# Patient Record
Sex: Female | Born: 1990 | Race: White | Hispanic: No | Marital: Married | Smoking: Never smoker
Health system: Southern US, Community
[De-identification: ages and names within clinical notes are randomized; demographics above are authoritative.]

## PROBLEM LIST (undated history)

## (undated) DIAGNOSIS — T7840XA Allergy, unspecified, initial encounter: Secondary | ICD-10-CM

## (undated) DIAGNOSIS — D649 Anemia, unspecified: Secondary | ICD-10-CM

## (undated) DIAGNOSIS — R011 Cardiac murmur, unspecified: Secondary | ICD-10-CM

## (undated) DIAGNOSIS — E213 Hyperparathyroidism, unspecified: Secondary | ICD-10-CM

## (undated) DIAGNOSIS — O139 Gestational [pregnancy-induced] hypertension without significant proteinuria, unspecified trimester: Secondary | ICD-10-CM

## (undated) HISTORY — DX: Cardiac murmur, unspecified: R01.1

## (undated) HISTORY — PX: WISDOM TOOTH EXTRACTION: SHX21

## (undated) HISTORY — DX: Hyperparathyroidism, unspecified: E21.3

## (undated) HISTORY — PX: PARATHYROIDECTOMY: SHX19

## (undated) HISTORY — DX: Allergy, unspecified, initial encounter: T78.40XA

---
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
## 2015-06-28 LAB — LIPID PANEL
LDL CALC: 80 mg/dL
TRIGLYCERIDES: 88 mg/dL (ref 40–160)

## 2016-01-29 DIAGNOSIS — Z713 Dietary counseling and surveillance: Secondary | ICD-10-CM | POA: Diagnosis not present

## 2016-02-26 ENCOUNTER — Encounter: Payer: Self-pay | Admitting: Family Medicine

## 2016-02-26 DIAGNOSIS — Z713 Dietary counseling and surveillance: Secondary | ICD-10-CM | POA: Diagnosis not present

## 2016-02-27 ENCOUNTER — Ambulatory Visit (INDEPENDENT_AMBULATORY_CARE_PROVIDER_SITE_OTHER): Payer: BLUE CROSS/BLUE SHIELD | Admitting: Family Medicine

## 2016-02-27 ENCOUNTER — Encounter: Payer: Self-pay | Admitting: Family Medicine

## 2016-02-27 VITALS — BP 130/81 | HR 70 | Temp 98.6°F | Resp 20 | Ht 60.5 in | Wt 123.8 lb

## 2016-02-27 DIAGNOSIS — Z7189 Other specified counseling: Secondary | ICD-10-CM

## 2016-02-27 DIAGNOSIS — R011 Cardiac murmur, unspecified: Secondary | ICD-10-CM | POA: Diagnosis not present

## 2016-02-27 DIAGNOSIS — Z7689 Persons encountering health services in other specified circumstances: Secondary | ICD-10-CM

## 2016-02-27 NOTE — Progress Notes (Addendum)
Patient ID: Padee Wadel, female   DOB: [birth year], 25 y.o.   MRN: [id number]      Patient ID: Amyria Schneiter, female  DOB: 03/26/1991, 25 y.o.   MRN: [id number]  Subjective:  Loza Bonder is a 25 y.o. female present for establish care.  All past medical history, surgical history, allergies, family history, immunizations, medications and social history were obtained in the electronic medical record today. All recent labs, ED visits and hospitalizations within the last year were reviewed.  Health maintenance:  Colonoscopy: No Fhx, Screen at 50.  Mammogram: FHX present. Screen at 40, baseline prior.  Cervical cancer screening: [facility] app[facility]. PAP due 04/2016, all  Normal. Had a cervical polyp removal.  Immunizations: UTD tdap (2008), Influenza yearly.  Infectious disease screening: HIV completed. DEXA: Screen at 58 Assistive device: None  Oxygen use: None  Patient has a Dental home. Hospitalizations/ED visits: None  Depression screen [facility] 2/9 02/27/2016  Decreased Interest 0  Down, Depressed, Hopeless 0  PHQ - 2 Score 0    Current Exercise Habits: Structured exercise class, Type of exercise: Other - see comments;yoga (marathon running), Time (Minutes): 60, Frequency (Times/Week): 3, Weekly Exercise (Minutes/Week): 180, Intensity: Moderate     Past Medical History  Diagnosis Date  . Heart murmur   . Allergy    Allergies  Allergen Reactions  . Amoxicillin Hives  . Keflex [Cephalexin] Hives  . Penicillins Hives   Past Surgical History  Procedure Laterality Date  . Wisdom tooth extraction     Family History  Problem Relation Age of Onset  . Skin cancer Mother   . Asthma Mother   . Breast cancer Paternal Aunt 42  . Heart disease Paternal Grandmother   . Breast cancer Cousin 13  . Dementia Maternal Grandmother   . Asthma Maternal Grandmother   . Multiple sclerosis Cousin   . Miscarriages / Stillbirths Paternal Aunt   . Heart disease Paternal  Grandfather    Social History   Social History  . Marital Status: Married    Spouse Name: N/A  . Number of Children: N/A  . Years of Education: N/A   Occupational History  . Not on file.   Social History Main Topics  . Smoking status: Never Smoker   . Smokeless tobacco: Not on file  . Alcohol Use: Yes     Comment: occasional  . Drug Use: No  . Sexual Activity: Yes    Birth Control/ Protection: Inserts     Comment: Nuvaring   Other Topics Concern  . 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    Drinks caffeine. Herbal remedy use. Daily vitamin use.    Wears her seatbelt and bicycle helmet.    Exercises routinely.    Regular diet.    Smoke detector in the home. No firearms in the home.    Feels safe in her relationships.       ROS: Negative, with the exception of above mentioned in HPI  Objective: BP 130/81 mmHg  Pulse 70  Temp(Src) 98.6 F (37 C) (Oral)  Resp 20  Ht 5' 0.5" (1.537 m)  Wt 123 lb 12 oz (56.133 kg)  BMI 23.76 kg/m2  SpO2 100%  LMP 02/26/2016 Gen: Afebrile. No acute distress. Nontoxic in appearance, well-developed, well-nourished, female, very pleasant.  HENT: AT. [place]. Bilateral TM visualized and normal in appearance, normal external auditory canal. MMM, no oral  lesions Eyes:Pupils Equal Round Reactive to light, Extraocular movements intact,  Conjunctiva without redness, discharge or icterus. Neck/lymp/endocrine: Supple, No  lymphadenopathy, No  thyromegaly CV: RRR, 2/6 SM with radiation to carotid.  No edema, +2/4 P posterior tibialis pulses.  No JVD. Chest: CTAB, no wheeze, rhonchi or crackles. Normal Respiratory effort. Good  Air movement. Abd: Soft. Flat . NTND. BS present . Skin:  Warm and well-perfused. Skin intact. Neuro/Msk: Normal gait. PERLA. EOMi. Alert. Oriented x3.   Psych: Normal affect, dress and demeanor. Normal speech. Normal thought content and  judgment.   Assessment/plan: Mori Duey is a 25 y.o. female present for establish care.  Heart murmur - discussed with pt today, she is asymptomatic, runs marathons in high elevations. Reportedly had full work up as a child and told not concerning.  - Agree with echo prior to pregnancy, since records of prior studies are not available. Pt aware to be seen immediately if Chest pain, shortness of breath or dyspnea on exertion occur.    Return in about 4 months (around 06/28/2016) for CPE.  Electronically signed by: Howard Pouch, DO [place]

## 2016-02-27 NOTE — Patient Instructions (Signed)
Health Maintenance, Female Adopting a healthy lifestyle and getting preventive care can go a long way to promote health and wellness. Talk with your health care provider about what schedule of regular examinations is right for you. This is a good chance for you to check in with your provider about disease prevention and staying healthy. In between checkups, there are plenty of things you can do on your own. Experts have done a lot of research about which lifestyle changes and preventive measures are most likely to keep you healthy. Ask your health care provider for more information. WEIGHT AND DIET  Eat a healthy diet  Be sure to include plenty of vegetables, fruits, low-fat dairy products, and lean protein.  Do not eat a lot of foods high in solid fats, added sugars, or salt.  Get regular exercise. This is one of the most important things you can do for your health.  Most adults should exercise for at least 150 minutes each week. The exercise should increase your heart rate and make you sweat (moderate-intensity exercise).  Most adults should also do strengthening exercises at least twice a week. This is in addition to the moderate-intensity exercise.  Maintain a healthy weight  Body mass index (BMI) is a measurement that can be used to identify possible weight problems. It estimates body fat based on height and weight. Your health care provider can help determine your BMI and help you achieve or maintain a healthy weight.  For females 20 years of age and older:   A BMI below 18.5 is considered underweight.  A BMI of 18.5 to 24.9 is normal.  A BMI of 25 to 29.9 is considered overweight.  A BMI of 30 and above is considered obese.  Watch levels of cholesterol and blood lipids  You should start having your blood tested for lipids and cholesterol at 27 years of age, then have this test every 5 years.  You may need to have your cholesterol levels checked more often if:  Your lipid  or cholesterol levels are high.  You are older than 27 years of age.  You are at high risk for heart disease.  CANCER SCREENING   Lung Cancer  Lung cancer screening is recommended for adults 55-80 years old who are at high risk for lung cancer because of a history of smoking.  A yearly low-dose CT scan of the lungs is recommended for people who:  Currently smoke.  Have quit within the past 15 years.  Have at least a 30-pack-year history of smoking. A pack year is smoking an average of one pack of cigarettes a day for 1 year.  Yearly screening should continue until it has been 15 years since you quit.  Yearly screening should stop if you develop a health problem that would prevent you from having lung cancer treatment.  Breast Cancer  Practice breast self-awareness. This means understanding how your breasts normally appear and feel.  It also means doing regular breast self-exams. Let your health care provider know about any changes, no matter how small.  If you are in your 20s or 30s, you should have a clinical breast exam (CBE) by a health care provider every 1-3 years as part of a regular health exam.  If you are 40 or older, have a CBE every year. Also consider having a breast X-ray (mammogram) every year.  If you have a family history of breast cancer, talk to your health care provider about genetic screening.  If you   are at high risk for breast cancer, talk to your health care provider about having an MRI and a mammogram every year.  Breast cancer gene (BRCA) assessment is recommended for women who have family members with BRCA-related cancers. BRCA-related cancers include:  Breast.  Ovarian.  Tubal.  Peritoneal cancers.  Results of the assessment will determine the need for genetic counseling and BRCA1 and BRCA2 testing. Cervical Cancer Your health care provider may recommend that you be screened regularly for cancer of the pelvic organs (ovaries, uterus, and  vagina). This screening involves a pelvic examination, including checking for microscopic changes to the surface of your cervix (Pap test). You may be encouraged to have this screening done every 3 years, beginning at age 21.  For women ages 30-65, health care providers may recommend pelvic exams and Pap testing every 3 years, or they may recommend the Pap and pelvic exam, combined with testing for human papilloma virus (HPV), every 5 years. Some types of HPV increase your risk of cervical cancer. Testing for HPV may also be done on women of any age with unclear Pap test results.  Other health care providers may not recommend any screening for nonpregnant women who are considered low risk for pelvic cancer and who do not have symptoms. Ask your health care provider if a screening pelvic exam is right for you.  If you have had past treatment for cervical cancer or a condition that could lead to cancer, you need Pap tests and screening for cancer for at least 20 years after your treatment. If Pap tests have been discontinued, your risk factors (such as having a new sexual partner) need to be reassessed to determine if screening should resume. Some women have medical problems that increase the chance of getting cervical cancer. In these cases, your health care provider may recommend more frequent screening and Pap tests. Colorectal Cancer  This type of cancer can be detected and often prevented.  Routine colorectal cancer screening usually begins at 27 years of age and continues through 27 years of age.  Your health care provider may recommend screening at an earlier age if you have risk factors for colon cancer.  Your health care provider may also recommend using home test kits to check for hidden blood in the stool.  A small camera at the end of a tube can be used to examine your colon directly (sigmoidoscopy or colonoscopy). This is done to check for the earliest forms of colorectal  cancer.  Routine screening usually begins at age 50.  Direct examination of the colon should be repeated every 5-10 years through 27 years of age. However, you may need to be screened more often if early forms of precancerous polyps or small growths are found. Skin Cancer  Check your skin from head to toe regularly.  Tell your health care provider about any new moles or changes in moles, especially if there is a change in a mole's shape or color.  Also tell your health care provider if you have a mole that is larger than the size of a pencil eraser.  Always use sunscreen. Apply sunscreen liberally and repeatedly throughout the day.  Protect yourself by wearing long sleeves, pants, a wide-brimmed hat, and sunglasses whenever you are outside. HEART DISEASE, DIABETES, AND HIGH BLOOD PRESSURE   High blood pressure causes heart disease and increases the risk of stroke. High blood pressure is more likely to develop in:  People who have blood pressure in the high end   of the normal range (130-139/85-89 mm Hg).  People who are overweight or obese.  People who are African American.  If you are 38-23 years of age, have your blood pressure checked every 3-5 years. If you are 61 years of age or older, have your blood pressure checked every year. You should have your blood pressure measured twice--once when you are at a hospital or clinic, and once when you are not at a hospital or clinic. Record the average of the two measurements. To check your blood pressure when you are not at a hospital or clinic, you can use:  An automated blood pressure machine at a pharmacy.  A home blood pressure monitor.  If you are between 45 years and 39 years old, ask your health care provider if you should take aspirin to prevent strokes.  Have regular diabetes screenings. This involves taking a blood sample to check your fasting blood sugar level.  If you are at a normal weight and have a low risk for diabetes,  have this test once every three years after 27 years of age.  If you are overweight and have a high risk for diabetes, consider being tested at a younger age or more often. PREVENTING INFECTION  Hepatitis B  If you have a higher risk for hepatitis B, you should be screened for this virus. You are considered at high risk for hepatitis B if:  You were born in a country where hepatitis B is common. Ask your health care provider which countries are considered high risk.  Your parents were born in a high-risk country, and you have not been immunized against hepatitis B (hepatitis B vaccine).  You have HIV or AIDS.  You use needles to inject street drugs.  You live with someone who has hepatitis B.  You have had sex with someone who has hepatitis B.  You get hemodialysis treatment.  You take certain medicines for conditions, including cancer, organ transplantation, and autoimmune conditions. Hepatitis C  Blood testing is recommended for:  Everyone born from [birth year] through 1967.  Anyone with known risk factors for hepatitis C. Sexually transmitted infections (STIs)  You should be screened for sexually transmitted infections (STIs) including gonorrhea and chlamydia if:  You are sexually active and are younger than 27 years of age.  You are older than 27 years of age and your health care provider tells you that you are at risk for this type of infection.  Your sexual activity has changed since you were last screened and you are at an increased risk for chlamydia or gonorrhea. Ask your health care provider if you are at risk.  If you do not have HIV, but are at risk, it may be recommended that you take a prescription medicine daily to prevent HIV infection. This is called pre-exposure prophylaxis (PrEP). You are considered at risk if:  You are sexually active and do not regularly use condoms or know the HIV status of your partner(s).  You take drugs by injection.  You are sexually  active with a partner who has HIV. Talk with your health care provider about whether you are at high risk of being infected with HIV. If you choose to begin PrEP, you should first be tested for HIV. You should then be tested every 3 months for as long as you are taking PrEP.  PREGNANCY   If you are premenopausal and you may become pregnant, ask your health care provider about preconception counseling.  If you may  become pregnant, take 400 to 800 micrograms (mcg) of folic acid every day.  If you want to prevent pregnancy, talk to your health care provider about birth control (contraception). OSTEOPOROSIS AND MENOPAUSE   Osteoporosis is a disease in which the bones lose minerals and strength with aging. This can result in serious bone fractures. Your risk for osteoporosis can be identified using a bone density scan.  If you are 103 years of age or older, or if you are at risk for osteoporosis and fractures, ask your health care provider if you should be screened.  Ask your health care provider whether you should take a calcium or vitamin D supplement to lower your risk for osteoporosis.  Menopause may have certain physical symptoms and risks.  Hormone replacement therapy may reduce some of these symptoms and risks. Talk to your health care provider about whether hormone replacement therapy is right for you.  HOME CARE INSTRUCTIONS   Schedule regular health, dental, and eye exams.  Stay current with your immunizations.   Do not use any tobacco products including cigarettes, chewing tobacco, or electronic cigarettes.  If you are pregnant, do not drink alcohol.  If you are breastfeeding, limit how much and how often you drink alcohol.  Limit alcohol intake to no more than 1 drink per day for nonpregnant women. One drink equals 12 ounces of beer, 5 ounces of wine, or 1 ounces of hard liquor.  Do not use street drugs.  Do not share needles.  Ask your health care provider for help if  you need support or information about quitting drugs.  Tell your health care provider if you often feel depressed.  Tell your health care provider if you have ever been abused or do not feel safe at home.   This information is not intended to replace advice given to you by your health care provider. Make sure you discuss any questions you have with your health care provider.   Document Released: 03/31/2011 Document Revised: 10/06/2014 Document Reviewed: 08/17/2013 Elsevier Interactive Patient Education [employer].  It was a pleasure meeting you today! If you need anything do not hesitate to call for appointment. I am looking forward to taking care of you and helping you stay healthy.  Dr. Raoul Pitch

## 2016-02-28 ENCOUNTER — Encounter: Payer: Self-pay | Admitting: Family Medicine

## 2016-04-22 ENCOUNTER — Telehealth: Payer: Self-pay | Admitting: *Deleted

## 2016-04-22 MED ORDER — ETONOGESTREL-ETHINYL ESTRADIOL 0.12-0.015 MG/24HR VA RING: VAGINAL_RING | VAGINAL | 0 refills | Status: DC

## 2016-04-22 NOTE — Telephone Encounter (Signed)
Patient called and left message requesting refill on nuvaring she states she has an upcoming appt with OB/GYN but is out now.

## 2016-04-22 NOTE — Telephone Encounter (Signed)
I have called in a refill for her nuvaring to get her to her GYN appt.

## 2016-04-22 NOTE — Telephone Encounter (Signed)
Received request from pharmacy to refill Nuvaring. Patient seen 02/27/16 as new patient .  We have never filled this. Please advise

## 2016-04-30 DIAGNOSIS — Z01419 Encounter for gynecological examination (general) (routine) without abnormal findings: Secondary | ICD-10-CM | POA: Diagnosis not present

## 2016-04-30 DIAGNOSIS — Z6824 Body mass index (BMI) 24.0-24.9, adult: Secondary | ICD-10-CM | POA: Diagnosis not present

## 2016-07-02 DIAGNOSIS — Z23 Encounter for immunization: Secondary | ICD-10-CM | POA: Diagnosis not present

## 2016-07-15 DIAGNOSIS — Z713 Dietary counseling and surveillance: Secondary | ICD-10-CM | POA: Diagnosis not present

## 2016-12-04 DIAGNOSIS — Z713 Dietary counseling and surveillance: Secondary | ICD-10-CM | POA: Diagnosis not present

## 2016-12-19 LAB — LIPID PANEL
CHOLESTEROL: 176 mg/dL (ref 0–200)
HDL: 81 mg/dL — AB (ref 35–70)

## 2017-02-09 ENCOUNTER — Encounter: Payer: BLUE CROSS/BLUE SHIELD | Admitting: Family Medicine

## 2017-02-13 ENCOUNTER — Encounter: Payer: Self-pay | Admitting: Family Medicine

## 2017-02-13 ENCOUNTER — Encounter: Payer: Self-pay | Admitting: *Deleted

## 2017-02-13 ENCOUNTER — Ambulatory Visit (INDEPENDENT_AMBULATORY_CARE_PROVIDER_SITE_OTHER): Payer: BLUE CROSS/BLUE SHIELD | Admitting: Family Medicine

## 2017-02-13 VITALS — BP 130/80 | HR 83 | Temp 98.1°F | Resp 20 | Ht 61.0 in | Wt 127.8 lb

## 2017-02-13 DIAGNOSIS — Z79899 Other long term (current) drug therapy: Secondary | ICD-10-CM

## 2017-02-13 DIAGNOSIS — Z0000 Encounter for general adult medical examination without abnormal findings: Secondary | ICD-10-CM

## 2017-02-13 DIAGNOSIS — R011 Cardiac murmur, unspecified: Secondary | ICD-10-CM | POA: Diagnosis not present

## 2017-02-13 DIAGNOSIS — Z130 Encounter for screening for diseases of the blood and blood-forming organs and certain disorders involving the immune mechanism: Secondary | ICD-10-CM | POA: Diagnosis not present

## 2017-02-13 DIAGNOSIS — Z23 Encounter for immunization: Secondary | ICD-10-CM

## 2017-02-13 LAB — COMPREHENSIVE METABOLIC PANEL
ALBUMIN: 4.7 g/dL (ref 3.5–5.2)
ALK PHOS: 55 U/L (ref 39–117)
ALT: 27 U/L (ref 0–35)
AST: 24 U/L (ref 0–37)
BUN: 14 mg/dL (ref 6–23)
CO2: 27 mEq/L (ref 19–32)
Calcium: 10.3 mg/dL (ref 8.4–10.5)
Chloride: 105 mEq/L (ref 96–112)
Creatinine, Ser: 0.86 mg/dL (ref 0.40–1.20)
GFR: 83.52 mL/min (ref >60.00)
GLUCOSE: 88 mg/dL (ref 70–99)
POTASSIUM: 4.1 meq/L (ref 3.5–5.1)
Sodium: 139 mEq/L (ref 135–145)
Total Bilirubin: 0.6 mg/dL (ref 0.2–1.2)
Total Protein: 7.4 g/dL (ref 6.0–8.3)

## 2017-02-13 LAB — CBC WITH DIFFERENTIAL/PLATELET
BASOS PCT: 0.7 % (ref 0.0–3.0)
Basophils Absolute: 0 10*3/uL (ref 0.0–0.1)
EOS PCT: 1.2 % (ref 0.0–5.0)
Eosinophils Absolute: 0.1 10*3/uL (ref 0.0–0.7)
HCT: 38.7 % (ref 36.0–46.0)
Hemoglobin: 13.2 g/dL (ref 12.0–15.0)
LYMPHS ABS: 3 10*3/uL (ref 0.7–4.0)
Lymphocytes Relative: 50 % — ABNORMAL HIGH (ref 12.0–46.0)
MCHC: 34.1 g/dL (ref 30.0–36.0)
MCV: 90 fl (ref 78.0–100.0)
MONOS PCT: 6.1 % (ref 3.0–12.0)
Monocytes Absolute: 0.4 10*3/uL (ref 0.1–1.0)
NEUTROS PCT: 42 % — AB (ref 43.0–77.0)
Neutro Abs: 2.5 10*3/uL (ref 1.4–7.7)
Platelets: 255 10*3/uL (ref 150.0–400.0)
RBC: 4.3 Mil/uL (ref 3.87–5.11)
RDW: 12.7 % (ref 11.5–15.5)
WBC: 5.9 10*3/uL (ref 4.0–10.5)

## 2017-02-13 LAB — TSH: TSH: 4.51 u[IU]/mL — AB (ref 0.35–4.50)

## 2017-02-13 NOTE — Patient Instructions (Signed)
Start prenatal at least 1 month before starting to try.    I will call you with labs once resulted. They will call to schedule echo of heart.   Health Maintenance, Female Adopting a healthy lifestyle and getting preventive care can go a long way to promote health and wellness. Talk with your health care provider about what schedule of regular examinations is right for you. This is a good chance for you to check in with your provider about disease prevention and staying healthy. In between checkups, there are plenty of things you can do on your own. Experts have done a lot of research about which lifestyle changes and preventive measures are most likely to keep you healthy. Ask your health care provider for more information. Weight and diet Eat a healthy diet  Be sure to include plenty of vegetables, fruits, low-fat dairy products, and lean protein.  Do not eat a lot of foods high in solid fats, added sugars, or salt.  Get regular exercise. This is one of the most important things you can do for your health.  Most adults should exercise for at least 150 minutes each week. The exercise should increase your heart rate and make you sweat (moderate-intensity exercise).  Most adults should also do strengthening exercises at least twice a week. This is in addition to the moderate-intensity exercise. Maintain a healthy weight  Body mass index (BMI) is a measurement that can be used to identify possible weight problems. It estimates body fat based on height and weight. Your health care provider can help determine your BMI and help you achieve or maintain a healthy weight.  For females 49 years of age and older:  A BMI below 18.5 is considered underweight.  A BMI of 18.5 to 24.9 is normal.  A BMI of 25 to 29.9 is considered overweight.  A BMI of 30 and above is considered obese. Watch levels of cholesterol and blood lipids  You should start having your blood tested for lipids and  cholesterol at 28 years of age, then have this test every 5 years.  You may need to have your cholesterol levels checked more often if:  Your lipid or cholesterol levels are high.  You are older than 28 years of age.  You are at high risk for heart disease. Cancer screening Lung Cancer  Lung cancer screening is recommended for adults 23-31 years old who are at high risk for lung cancer because of a history of smoking.  A yearly low-dose CT scan of the lungs is recommended for people who:  Currently smoke.  Have quit within the past 15 years.  Have at least a 30-pack-year history of smoking. A pack year is smoking an average of one pack of cigarettes a day for 1 year.  Yearly screening should continue until it has been 15 years since you quit.  Yearly screening should stop if you develop a health problem that would prevent you from having lung cancer treatment. Breast Cancer  Practice breast self-awareness. This means understanding how your breasts normally appear and feel.  It also means doing regular breast self-exams. Let your health care provider know about any changes, no matter how small.  If you are in your 20s or 30s, you should have a clinical breast exam (CBE) by a health care provider every 1-3 years as part of a regular health exam.  If you are 46 or older, have a CBE every year. Also consider having a breast X-ray (mammogram) every  year.  If you have a family history of breast cancer, talk to your health care provider about genetic screening.  If you are at high risk for breast cancer, talk to your health care provider about having an MRI and a mammogram every year.  Breast cancer gene (BRCA) assessment is recommended for women who have family members with BRCA-related cancers. BRCA-related cancers include:  Breast.  Ovarian.  Tubal.  Peritoneal cancers.  Results of the assessment will determine the need for genetic counseling and BRCA1 and BRCA2  testing. Cervical Cancer  Your health care provider may recommend that you be screened regularly for cancer of the pelvic organs (ovaries, uterus, and vagina). This screening involves a pelvic examination, including checking for microscopic changes to the surface of your cervix (Pap test). You may be encouraged to have this screening done every 3 years, beginning at age 82.  For women ages 33-65, health care providers may recommend pelvic exams and Pap testing every 3 years, or they may recommend the Pap and pelvic exam, combined with testing for human papilloma virus (HPV), every 5 years. Some types of HPV increase your risk of cervical cancer. Testing for HPV may also be done on women of any age with unclear Pap test results.  Other health care providers may not recommend any screening for nonpregnant women who are considered low risk for pelvic cancer and who do not have symptoms. Ask your health care provider if a screening pelvic exam is right for you.  If you have had past treatment for cervical cancer or a condition that could lead to cancer, you need Pap tests and screening for cancer for at least 20 years after your treatment. If Pap tests have been discontinued, your risk factors (such as having a new sexual partner) need to be reassessed to determine if screening should resume. Some women have medical problems that increase the chance of getting cervical cancer. In these cases, your health care provider may recommend more frequent screening and Pap tests. Colorectal Cancer  This type of cancer can be detected and often prevented.  Routine colorectal cancer screening usually begins at 28 years of age and continues through 28 years of age.  Your health care provider may recommend screening at an earlier age if you have risk factors for colon cancer.  Your health care provider may also recommend using home test kits to check for hidden blood in the stool.  A small camera at the end of a  tube can be used to examine your colon directly (sigmoidoscopy or colonoscopy). This is done to check for the earliest forms of colorectal cancer.  Routine screening usually begins at age 69.  Direct examination of the colon should be repeated every 5-10 years through 28 years of age. However, you may need to be screened more often if early forms of precancerous polyps or small growths are found. Skin Cancer  Check your skin from head to toe regularly.  Tell your health care provider about any new moles or changes in moles, especially if there is a change in a mole's shape or color.  Also tell your health care provider if you have a mole that is larger than the size of a pencil eraser.  Always use sunscreen. Apply sunscreen liberally and repeatedly throughout the day.  Protect yourself by wearing long sleeves, pants, a wide-brimmed hat, and sunglasses whenever you are outside. Heart disease, diabetes, and high blood pressure  High blood pressure causes heart disease and increases  the risk of stroke. High blood pressure is more likely to develop in:  People who have blood pressure in the high end of the normal range (130-139/85-89 mm Hg).  People who are overweight or obese.  People who are African American.  If you are 75-35 years of age, have your blood pressure checked every 3-5 years. If you are 80 years of age or older, have your blood pressure checked every year. You should have your blood pressure measured twice-once when you are at a hospital or clinic, and once when you are not at a hospital or clinic. Record the average of the two measurements. To check your blood pressure when you are not at a hospital or clinic, you can use:  An automated blood pressure machine at a pharmacy.  A home blood pressure monitor.  If you are between 41 years and 32 years old, ask your health care provider if you should take aspirin to prevent strokes.  Have regular diabetes screenings. This  involves taking a blood sample to check your fasting blood sugar level.  If you are at a normal weight and have a low risk for diabetes, have this test once every three years after 28 years of age.  If you are overweight and have a high risk for diabetes, consider being tested at a younger age or more often. Preventing infection Hepatitis B  If you have a higher risk for hepatitis B, you should be screened for this virus. You are considered at high risk for hepatitis B if:  You were born in a country where hepatitis B is common. Ask your health care provider which countries are considered high risk.  Your parents were born in a high-risk country, and you have not been immunized against hepatitis B (hepatitis B vaccine).  You have HIV or AIDS.  You use needles to inject street drugs.  You live with someone who has hepatitis B.  You have had sex with someone who has hepatitis B.  You get hemodialysis treatment.  You take certain medicines for conditions, including cancer, organ transplantation, and autoimmune conditions. Hepatitis C  Blood testing is recommended for:  Everyone born from [birth year] through 1967.  Anyone with known risk factors for hepatitis C. Sexually transmitted infections (STIs)  You should be screened for sexually transmitted infections (STIs) including gonorrhea and chlamydia if:  You are sexually active and are younger than 28 years of age.  You are older than 28 years of age and your health care provider tells you that you are at risk for this type of infection.  Your sexual activity has changed since you were last screened and you are at an increased risk for chlamydia or gonorrhea. Ask your health care provider if you are at risk.  If you do not have HIV, but are at risk, it may be recommended that you take a prescription medicine daily to prevent HIV infection. This is called pre-exposure prophylaxis (PrEP). You are considered at risk if:  You are  sexually active and do not regularly use condoms or know the HIV status of your partner(s).  You take drugs by injection.  You are sexually active with a partner who has HIV. Talk with your health care provider about whether you are at high risk of being infected with HIV. If you choose to begin PrEP, you should first be tested for HIV. You should then be tested every 3 months for as long as you are taking PrEP. Pregnancy  If  you are premenopausal and you may become pregnant, ask your health care provider about preconception counseling.  If you may become pregnant, take 400 to 800 micrograms (mcg) of folic acid every day.  If you want to prevent pregnancy, talk to your health care provider about birth control (contraception). Osteoporosis and menopause  Osteoporosis is a disease in which the bones lose minerals and strength with aging. This can result in serious bone fractures. Your risk for osteoporosis can be identified using a bone density scan.  If you are 10 years of age or older, or if you are at risk for osteoporosis and fractures, ask your health care provider if you should be screened.  Ask your health care provider whether you should take a calcium or vitamin D supplement to lower your risk for osteoporosis.  Menopause may have certain physical symptoms and risks.  Hormone replacement therapy may reduce some of these symptoms and risks. Talk to your health care provider about whether hormone replacement therapy is right for you. Follow these instructions at home:  Schedule regular health, dental, and eye exams.  Stay current with your immunizations.  Do not use any tobacco products including cigarettes, chewing tobacco, or electronic cigarettes.  If you are pregnant, do not drink alcohol.  If you are breastfeeding, limit how much and how often you drink alcohol.  Limit alcohol intake to no more than 1 drink per day for nonpregnant women. One drink equals 12 ounces of  beer, 5 ounces of wine, or 1 ounces of hard liquor.  Do not use street drugs.  Do not share needles.  Ask your health care provider for help if you need support or information about quitting drugs.  Tell your health care provider if you often feel depressed.  Tell your health care provider if you have ever been abused or do not feel safe at home. This information is not intended to replace advice given to you by your health care provider. Make sure you discuss any questions you have with your health care provider. Document Released: 03/31/2011 Document Revised: 02/21/2016 Document Reviewed: 06/19/2015 Elsevier Interactive Patient Education  2017 [employer]  Please help [place] help you:  We are honored you have chosen [facility] for your Primary Care home. Below you will find basic instructions that you may need to access in the future. Please help [place] help you by reading the instructions, which cover many of the frequent questions we experience.   Prescription refills and request:  -In order to allow more efficient response time, please call your pharmacy for all refills. They will forward the request electronically to [place]. This allows for the quickest possible response. Request left on a nurse line can take longer to refill, since these are checked as time allows between office patients and other phone calls.  - refill request can take up to 3-5 working days to complete.  - If request is sent electronically and request is appropiate, it is usually completed in 1-2 business days.  - all patients will need to be seen routinely for all chronic medical conditions requiring prescription medications (see follow-up below). If you are overdue for follow up on your condition, you will be asked to make an appointment and we will call in enough medication to cover you until your appointment (up to 30 days).  - all controlled substances will require a face to face visit to request/refill.  - if  you desire your prescriptions to go through a new pharmacy,  and have an active script at original pharmacy, you will need to call your pharmacy and have scripts transferred to new pharmacy. This is completed between the pharmacy locations and not by your provider.    Results: If any images or labs were ordered, it can take up to 1 week to get results depending on the test ordered and the lab/facility running and resulting the test. - Normal or stable results, which do not need further discussion, may be released to your mychart immediately with attached note to you. A call may not be generated for normal results. Please make certain to sign up for mychart. If you have questions on how to activate your mychart you can call the front office.  - If your results need further discussion, our office will attempt to contact you via phone, and if unable to reach you after 2 attempts, we will release your abnormal result to your mychart with instructions.  - All results will be automatically released in mychart after 1 week.  - Your provider will provide you with explanation and instruction on all relevant material in your results. Please keep in mind, results and labs may appear confusing or abnormal to the untrained eye, but it does not mean they are actually abnormal for you personally. If you have any questions about your results that are not covered, or you desire more detailed explanation than what was provided, you should make an appointment with your provider to do so.   Our office handles many outgoing and incoming calls daily. If we have not contacted you within 1 week about your results, please check your mychart to see if there is a message first and if not, then contact our office.  In helping with this matter, you help decrease call volume, and therefore allow [place] to be able to respond to patients needs more efficiently.   Acute office visits (sick visit):  An acute visit is intended for a new  problem and are scheduled in shorter time slots to allow schedule openings for patients with new problems. This is the appropriate visit to discuss a new problem. In order to provide you with excellent quality medical care with proper time for you to explain your problem, have an exam and receive treatment with instructions, these appointments should be limited to one new problem per visit. If you experience a new problem, in which you desire to be addressed, please make an acute office visit, we save openings on the schedule to accommodate you. Please do not save your new problem for any other type of visit, let us take care of it properly and quickly for you.   Follow up visits:  Depending on your condition(s) your provider will need to see you routinely in order to provide you with quality care and prescribe medication(s). Most chronic conditions (Example: hypertension, Diabetes, depression/anxiety... etc), require visits a couple times a year. Your provider will instruct you on proper follow up for your personal medical conditions and history. Please make certain to make follow up appointments for your condition as instructed. Failing to do so could result in lapse in your medication treatment/refills. If you request a refill, and are overdue to be seen on a condition, we will always provide you with a 30 day script (once) to allow you time to schedule.    Medicare wellness (well visit): - we have a wonderful Nurse Selena Batten), that will meet with you and provide you will yearly medicare wellness visits. These visits should  occur yearly (can not be scheduled less than 1 calendar year apart) and cover preventive health, immunizations, advance directives and screenings you are entitled to yearly through your medicare benefits. Do not miss out on your entitled benefits, this is when medicare will pay for these benefits to be ordered for you.  These are strongly encouraged by your provider and is the appropriate  type of visit to make certain you are up to date with all preventive health benefits. If you have not had your medicare wellness exam in the last 12 months, please make certain to schedule one by calling the office and schedule your medicare wellness with Maudie Mercury as soon as possible.   Yearly physical (well visit):  - Adults are recommended to be seen yearly for physicals. Check with your insurance and date of your last physical, most insurances require one calendar year between physicals. Physicals include all preventive health topics, screenings, medical exam and labs that are appropriate for gender/age and history. You may have fasting labs needed at this visit. This is a well visit (not a sick visit), new problems should not be covered during this visit (see acute visit).  - Pediatric patients are seen more frequently when they are younger. Your provider will advise you on well child visit timing that is appropriate for your their age. - This is not a medicare wellness visit. Medicare wellness exams do not have an exam portion to the visit. Some medicare companies allow for a physical, some do not allow a yearly physical. If your medicare allows a yearly physical you can schedule the medicare wellness with our nurse Maudie Mercury and have your physical with your provider after, on the same day. Please check with insurance for your full benefits.   Late Policy/No Shows:  - all new patients should arrive 15-30 minutes earlier than appointment to allow [place] time  to  obtain all personal demographics,  insurance information and for you to complete office paperwork. - All established patients should arrive 10-15 minutes earlier than appointment time to update all information and be checked in .  - In our best efforts to run on time, if you are late for your appointment you will be asked to either reschedule or if able, we will work you back into the schedule. There will be a wait time to work you back in the schedule,   depending on availability.  - If you are unable to make it to your appointment as scheduled, please call 24 hours ahead of time to allow [place] to fill the time slot with someone else who needs to be seen. If you do not cancel your appointment ahead of time, you may be charged a no show fee.

## 2017-02-13 NOTE — Progress Notes (Signed)
Patient ID: Kristina Rhodes, female  DOB: 07-25-1991, 26 y.o.   MRN: [id number] Patient Care Team    Relationship Specialty Notifications Start End  Ma Hillock, DO PCP - General Family Medicine  02/27/16   Linda Hedges, DO Consulting Physician Obstetrics and Gynecology  02/13/17     Chief Complaint  Patient presents with  . Annual Exam    Subjective:  Kristina Rhodes is a 26 y.o.  Female  present for CPE . All past medical history, surgical history, allergies, family history, immunizations, medications and social history were updated in the electronic medical record today. All recent labs, ED visits and hospitalizations within the last year were reviewed.  Murmur: now trying to become pregnant.  Pt doe snot recall the specifics around murmur. 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 She denies chest pain, shortness of breath or LE edema. She runs daily.  Health maintenance:  Colonoscopy: No Fhx, Screen at 50.  Mammogram: FHX present. Screen at 40, baseline prior.  Cervical cancer screening: has GYN, Dr. Lynnette Caffey UTD 2017 Immunizations: UTD tdap today, Influenza yearly.  Infectious disease screening: HIV completed. DEXA: Screen at 58 Assistive device: none Oxygen YDS:WVTV Patient has a Dental home. Hospitalizations/ED visits: none   Depression screen [facility] 2/9 02/13/2017 02/27/2016  Decreased Interest 0 0  Down, Depressed, Hopeless 0 0  PHQ - 2 Score 0 0   No flowsheet data found.   Current Exercise Habits: Home exercise routine, Time (Minutes): 60, Frequency (Times/Week): 3, Weekly Exercise (Minutes/Week): 180, Intensity: Moderate Exercise limited by: None identified   Immunization History  Administered Date(s) Administered  . DTaP 04/20/1989, 07/08/1989, 09/03/1989, 08/30/1990, 06/24/1994  . HPV Quadrivalent 09/16/2004, 11/21/2005, 03/18/2006  . Hepatitis A 09/16/2005, 03/18/2006  . Hepatitis B 06/24/1994, 08/06/1994,  01/23/1995  . IPV 04/20/1989, 08/30/1990, 06/24/1994  . Influenza Split 07/08/1989, 06/29/2013  . MMR 05/26/1990, 06/05/1994  . Meningococcal Conjugate 03/18/2006  . Td 12/13/2002  . Tdap 03/22/2007, 02/13/2017  . Varicella 08/06/1994, 03/08/2007     Past Medical History:  Diagnosis Date  . Allergy   . Heart murmur    Allergies  Allergen Reactions  . Amoxicillin Hives  . Keflex [Cephalexin] Hives  . Penicillins Hives   Past Surgical History:  Procedure Laterality Date  . WISDOM TOOTH EXTRACTION     Family History  Problem Relation Age of Onset  . Skin cancer Mother   . Asthma Mother   . Breast cancer Paternal Aunt 54  . Heart disease Paternal Grandmother   . Breast cancer Cousin 21  . Dementia Maternal Grandmother   . Asthma Maternal Grandmother   . Multiple sclerosis Cousin   . Miscarriages / Stillbirths Paternal Aunt   . Heart disease Paternal Grandfather    Social History   Social History  . Marital status: Married    Spouse name: N/A  . Number of children: N/A  . Years of education: N/A   Occupational History  . Not on file.   Social History Main Topics  . Smoking status: Never Smoker  . Smokeless tobacco: Never Used  . Alcohol use Yes     Comment: occasional  . Drug use: No  . Sexual activity: Yes    Birth control/ protection: None   Other Topics Concern  . 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    Drinks caffeine.  Herbal remedy use. Daily vitamin use.    Wears her seatbelt and bicycle helmet.    Exercises routinely.    Regular diet.    Smoke detector in the home. No firearms in the home.    Feels safe in her relationships.       Allergies as of 02/13/2017      Reactions   Amoxicillin Hives   Keflex [cephalexin] Hives   Penicillins Hives      Medication List       Accurate as of 02/13/17 10:25 AM. Always use your most recent med list.          ascorbic acid 500  MG tablet Commonly known as:  VITAMIN C Take by mouth.   loratadine 10 MG tablet Commonly known as:  CLARITIN Take 10 mg by mouth daily.   multivitamin with minerals tablet Take 1 tablet by mouth daily.       All past medical history, surgical history, allergies, family history, immunizations andmedications were updated in the EMR today and reviewed under the history and medication portions of their EMR.     Recent Results (from the past 2160 hour(s))  Lipid panel     Status: Abnormal   Collection Time: 12/19/16 12:00 AM  Result Value Ref Range   Cholesterol 176 0 - 200 mg/dL   HDL 81 (A) 35 - 70 mg/dL    Patient was never admitted.   ROS: 14 pt review of systems performed and negative (unless mentioned in an HPI)  Objective: BP 130/80 (BP Location: Left Arm, Patient Position: Sitting, Cuff Size: Normal)   Pulse 83   Temp 98.1 F (36.7 C)   Resp 20   Ht '5\' 1"'$  (1.549 m)   Wt 127 lb 12 oz (57.9 kg)   LMP 02/05/2017   SpO2 100%   BMI 24.14 kg/m  Gen: Afebrile. No acute distress. Nontoxic in appearance, well-developed, well-nourished,   HENT: AT. [place]. Bilateral TM visualized and normal in appearance, normal external auditory canal. MMM, no oral lesions, adequate dentition. Bilateral nares within normal limits. Throat without erythema, ulcerations or exudates. no Cough on exam, no hoarseness on exam. Eyes:Pupils Equal Round Reactive to light, Extraocular movements intact,  Conjunctiva without redness, discharge or icterus. Neck/lymp/endocrine: Supple,no lymphadenopathy, no thyromegaly CV: RRR 1/6 SM LSB, noedema, +2/4 P posterior tibialis pulses. no carotid bruits. No JVD. Chest: CTAB, no wheeze, rhonchi or crackles. normal Respiratory effort. good Air movement. Abd: Soft. flat. NTND. BS present. no Masses palpated. No hepatosplenomegaly. No rebound tenderness or guarding. Skin: No rashes, purpura or petechiae. Warm and well-perfused. Skin intact. Neuro/Msk: Normal gait.  PERLA. EOMi. Alert. Oriented x3.  Cranial nerves II through XII intact. Muscle strength 5/5 upper/lower extremity. DTRs equal bilaterally. Psych: Normal affect, dress and demeanor. Normal speech. Normal thought content and judgment.   No exam data present  Assessment/plan: Honor Frison is a 26 y.o. female present for CPE. Encounter for preventive health examination Patient was encouraged to exercise greater than 150 minutes a week. Patient was encouraged to choose a diet filled with fresh fruits and vegetables, and lean meats. AVS provided to patient today for education/recommendation on gender specific health and safety maintenance. tdap provided.  All screenings and immunizations UTD.  Outside labs abstracted into system today.  Start prenatal vitamins at least 1 month before trying.  Immunization due - Tdap vaccine greater than or equal to 7yo IM Heart murmur - Will forward echo to GYN once received. If needed will refer  to Cardio.  - TSH - ECHOCARDIOGRAM COMPLETE; Future Screening for deficiency anemia - CBC w/Diff Encounter for long-term (current) use of medications - Comp Met (CMET) - TSH    Return in about 1 year (around 02/13/2018) for CPE.  Electronically signed by: Howard Pouch, DO [place]

## 2017-02-17 ENCOUNTER — Telehealth: Payer: Self-pay | Admitting: Family Medicine

## 2017-02-17 DIAGNOSIS — R7989 Other specified abnormal findings of blood chemistry: Secondary | ICD-10-CM

## 2017-02-17 NOTE — Telephone Encounter (Signed)
Left message on  Voice mail for patient to return call.

## 2017-02-17 NOTE — Telephone Encounter (Signed)
Error

## 2017-02-17 NOTE — Telephone Encounter (Signed)
Patient advised of normal results

## 2017-02-17 NOTE — Telephone Encounter (Signed)
Please call pt: - her labs are stable.  - her thyroid is 4.51, which reports abnormal range normal is 4.5. It is normal to have fluctuations such as this, but she should discuss with her GYN/OB and have it monitored again during pregnancy or in 3 months (either at GYN or here). - Future labs placed for here, in 2 months if she desires. Can have labs collected 2 days prior to a provider appt.

## 2017-03-11 ENCOUNTER — Ambulatory Visit (HOSPITAL_BASED_OUTPATIENT_CLINIC_OR_DEPARTMENT_OTHER)
Admission: RE | Admit: 2017-03-11 | Discharge: 2017-03-11 | Disposition: A | Discharge status: Home / Self Care | Payer: BLUE CROSS/BLUE SHIELD | Source: Ambulatory Visit | Attending: Family Medicine | Admitting: Family Medicine

## 2017-03-11 ENCOUNTER — Telehealth: Payer: Self-pay | Admitting: Family Medicine

## 2017-03-11 DIAGNOSIS — R011 Cardiac murmur, unspecified: Secondary | ICD-10-CM

## 2017-03-11 DIAGNOSIS — I340 Nonrheumatic mitral (valve) insufficiency: Secondary | ICD-10-CM | POA: Diagnosis not present

## 2017-03-11 NOTE — Telephone Encounter (Signed)
Left message with Echo results and information on patient voice mail per DPR.

## 2017-03-11 NOTE — Progress Notes (Signed)
  Echocardiogram 2D Echocardiogram has been performed.  Johny Chess 03/11/2017, 9:07 AM

## 2017-03-11 NOTE — Telephone Encounter (Signed)
Please call pt:  - her echo looks good. Just a very minor/trival Mitral valve regurgitation, which should not need any further follow up. This is commonly seen and typically does not cause any issues, will forward result ot GYN as well.

## 2017-06-18 DIAGNOSIS — Z23 Encounter for immunization: Secondary | ICD-10-CM | POA: Diagnosis not present

## 2017-08-04 DIAGNOSIS — N911 Secondary amenorrhea: Secondary | ICD-10-CM | POA: Diagnosis not present

## 2017-08-13 DIAGNOSIS — Z3400 Encounter for supervision of normal first pregnancy, unspecified trimester: Secondary | ICD-10-CM | POA: Diagnosis not present

## 2017-08-13 DIAGNOSIS — Z3402 Encounter for supervision of normal first pregnancy, second trimester: Secondary | ICD-10-CM | POA: Diagnosis not present

## 2017-08-13 DIAGNOSIS — Z3A08 8 weeks gestation of pregnancy: Secondary | ICD-10-CM | POA: Diagnosis not present

## 2017-08-13 DIAGNOSIS — Z3480 Encounter for supervision of other normal pregnancy, unspecified trimester: Secondary | ICD-10-CM | POA: Diagnosis not present

## 2017-08-13 DIAGNOSIS — Z3685 Encounter for antenatal screening for Streptococcus B: Secondary | ICD-10-CM | POA: Diagnosis not present

## 2017-08-13 DIAGNOSIS — Z3403 Encounter for supervision of normal first pregnancy, third trimester: Secondary | ICD-10-CM | POA: Diagnosis not present

## 2017-08-13 DIAGNOSIS — Z3401 Encounter for supervision of normal first pregnancy, first trimester: Secondary | ICD-10-CM | POA: Diagnosis not present

## 2017-08-13 DIAGNOSIS — Z3A09 9 weeks gestation of pregnancy: Secondary | ICD-10-CM | POA: Diagnosis not present

## 2017-08-13 LAB — OB RESULTS CONSOLE RPR: RPR: NONREACTIVE

## 2017-08-13 LAB — OB RESULTS CONSOLE RUBELLA ANTIBODY, IGM: RUBELLA: UNDETERMINED

## 2017-08-13 LAB — OB RESULTS CONSOLE ABO/RH: RH Type: POSITIVE

## 2017-08-13 LAB — OB RESULTS CONSOLE HEPATITIS B SURFACE ANTIGEN: Hepatitis B Surface Ag: NEGATIVE

## 2017-08-13 LAB — OB RESULTS CONSOLE HIV ANTIBODY (ROUTINE TESTING): HIV: NONREACTIVE

## 2017-08-25 DIAGNOSIS — Z3A09 9 weeks gestation of pregnancy: Secondary | ICD-10-CM | POA: Diagnosis not present

## 2017-08-25 DIAGNOSIS — Z3480 Encounter for supervision of other normal pregnancy, unspecified trimester: Secondary | ICD-10-CM | POA: Diagnosis not present

## 2017-08-25 DIAGNOSIS — Z3400 Encounter for supervision of normal first pregnancy, unspecified trimester: Secondary | ICD-10-CM | POA: Diagnosis not present

## 2017-08-25 LAB — OB RESULTS CONSOLE GC/CHLAMYDIA
CHLAMYDIA, DNA PROBE: NEGATIVE
Gonorrhea: NEGATIVE

## 2017-08-25 LAB — OB RESULTS CONSOLE GBS: STREP GROUP B AG: NEGATIVE

## 2017-09-08 DIAGNOSIS — G8911 Acute pain due to trauma: Secondary | ICD-10-CM | POA: Diagnosis not present

## 2017-09-08 DIAGNOSIS — S42009A Fracture of unspecified part of unspecified clavicle, initial encounter for closed fracture: Secondary | ICD-10-CM | POA: Diagnosis not present

## 2017-09-10 DIAGNOSIS — Z3683 Encounter for fetal screening for congenital cardiac abnormalities: Secondary | ICD-10-CM | POA: Diagnosis not present

## 2017-09-10 DIAGNOSIS — Z3491 Encounter for supervision of normal pregnancy, unspecified, first trimester: Secondary | ICD-10-CM | POA: Diagnosis not present

## 2017-09-10 DIAGNOSIS — Z36 Encounter for antenatal screening for chromosomal anomalies: Secondary | ICD-10-CM | POA: Diagnosis not present

## 2017-09-10 DIAGNOSIS — Z3A12 12 weeks gestation of pregnancy: Secondary | ICD-10-CM | POA: Diagnosis not present

## 2017-10-22 DIAGNOSIS — Z363 Encounter for antenatal screening for malformations: Secondary | ICD-10-CM | POA: Diagnosis not present

## 2017-10-22 DIAGNOSIS — Z3480 Encounter for supervision of other normal pregnancy, unspecified trimester: Secondary | ICD-10-CM | POA: Diagnosis not present

## 2017-11-20 ENCOUNTER — Encounter: Payer: Self-pay | Admitting: Family Medicine

## 2017-11-20 ENCOUNTER — Ambulatory Visit: Payer: BLUE CROSS/BLUE SHIELD | Admitting: Family Medicine

## 2017-11-20 VITALS — BP 118/62 | HR 94 | Temp 98.6°F | Ht 61.0 in | Wt 144.0 lb

## 2017-11-20 DIAGNOSIS — J069 Acute upper respiratory infection, unspecified: Secondary | ICD-10-CM

## 2017-11-20 NOTE — Progress Notes (Signed)
OFFICE VISIT  11/20/2017   CC:  Chief Complaint  Patient presents with  . Sinusitis   HPI:    Patient is a 27 y.o. Caucasian female who presents for respiratory symptoms. Onset yesterday, runny nose, then nasal/facial congestion and pressure.  ST yesterday. No cough.  Has some PND.  Diffuse upper teeth pain-mild, no signif max sinus pain.  No purulent-appearing nasal mucous. HA.  No fever.  Went to [facility] for her pregnancy check up today: 22 wks--having a girl.  Past Medical History:  Diagnosis Date  . Allergy   . Heart murmur     Past Surgical History:  Procedure Laterality Date  . WISDOM TOOTH EXTRACTION      Outpatient Medications Prior to Visit  Medication Sig Dispense Refill  . Docosahexaenoic Acid (PRENATAL DHA) 200 MG CAPS Take by mouth.    Marland Kitchen ascorbic acid (VITAMIN C) 500 MG tablet Take by mouth.    . loratadine (CLARITIN) 10 MG tablet Take 10 mg by mouth daily.    . Multiple Vitamins-Minerals (MULTIVITAMIN WITH MINERALS) tablet Take 1 tablet by mouth daily.     No facility-administered medications prior to visit.     Allergies  Allergen Reactions  . Amoxicillin Hives  . Keflex [Cephalexin] Hives  . Penicillins Hives  . Cephalosporins Rash    ROS As per HPI  PE: Blood pressure 118/62, pulse 94, temperature 98.6 F (37 C), temperature source Oral, height 5\' 1"  (1.549 m), weight 144 lb (65.3 kg), SpO2 99 %. VS: noted--normal. Gen: alert, NAD, NONTOXIC APPEARING. HEENT: eyes without injection, drainage, or swelling.  Ears: EACs clear, TMs with normal light reflex and landmarks.  Nose: Clear rhinorrhea, with some dried, crusty exudate adherent to mildly injected mucosa.  No purulent d/c.  No paranasal sinus TTP.  No facial swelling.  Throat and mouth without focal lesion.  No pharyngial swelling, erythema, or exudate.   Neck: supple, no LAD.   LUNGS: CTA bilat, nonlabored resps.   CV: RRR, no m/r/g. EXT: no c/c/e SKIN: no rash  LABS:    Chemistry       Component Value Date/Time   NA 139 02/13/2017 0859   K 4.1 02/13/2017 0859   CL 105 02/13/2017 0859   CO2 27 02/13/2017 0859   BUN 14 02/13/2017 0859   CREATININE 0.86 02/13/2017 0859      Component Value Date/Time   CALCIUM 10.3 02/13/2017 0859   ALKPHOS 55 02/13/2017 0859   AST 24 02/13/2017 0859   ALT 27 02/13/2017 0859   BILITOT 0.6 02/13/2017 0859       IMPRESSION AND PLAN:  Viral URI. Recommended saline nasal spray prn. Signs/symptoms to call or return for were reviewed and pt expressed understanding.  An After Visit Summary was printed and given to the patient.  FOLLOW UP: Return if symptoms worsen or fail to improve.  Signed:  Crissie Sickles, MD           11/20/2017

## 2017-11-20 NOTE — Patient Instructions (Addendum)
Use otc generic saline nasal spray 2-3 times per day to irrigate/moisturize your nasal passages.   

## 2017-12-17 DIAGNOSIS — Z3480 Encounter for supervision of other normal pregnancy, unspecified trimester: Secondary | ICD-10-CM | POA: Diagnosis not present

## 2017-12-17 DIAGNOSIS — Z23 Encounter for immunization: Secondary | ICD-10-CM | POA: Diagnosis not present

## 2017-12-24 DIAGNOSIS — D509 Iron deficiency anemia, unspecified: Secondary | ICD-10-CM | POA: Diagnosis not present

## 2017-12-24 DIAGNOSIS — O99810 Abnormal glucose complicating pregnancy: Secondary | ICD-10-CM | POA: Diagnosis not present

## 2017-12-24 DIAGNOSIS — Z3A27 27 weeks gestation of pregnancy: Secondary | ICD-10-CM | POA: Diagnosis not present

## 2018-01-05 DIAGNOSIS — Z3A28 28 weeks gestation of pregnancy: Secondary | ICD-10-CM | POA: Diagnosis not present

## 2018-01-05 DIAGNOSIS — O26853 Spotting complicating pregnancy, third trimester: Secondary | ICD-10-CM | POA: Diagnosis not present

## 2018-01-06 ENCOUNTER — Encounter: Payer: BLUE CROSS/BLUE SHIELD | Attending: Obstetrics & Gynecology | Admitting: *Deleted

## 2018-01-06 DIAGNOSIS — O99810 Abnormal glucose complicating pregnancy: Secondary | ICD-10-CM | POA: Insufficient documentation

## 2018-01-06 DIAGNOSIS — Z3A00 Weeks of gestation of pregnancy not specified: Secondary | ICD-10-CM | POA: Insufficient documentation

## 2018-01-06 DIAGNOSIS — Z713 Dietary counseling and surveillance: Secondary | ICD-10-CM | POA: Insufficient documentation

## 2018-01-06 DIAGNOSIS — R7302 Impaired glucose tolerance (oral): Secondary | ICD-10-CM

## 2018-01-06 NOTE — Progress Notes (Signed)
  Patient was seen on 01/06/2018 for Gestational Diabetes self-management. EDD 03/24/2018. Patient states no history of GDM. Diet history obtained. Patient eats excellent variety of all food groups and beverages include only water.  The following learning objectives were met by the patient :   States the definition of Gestational Diabetes  States why dietary management is important in controlling blood glucose  Describes the effects of carbohydrates on blood glucose levels  Demonstrates ability to create a balanced meal plan  Demonstrates carbohydrate counting   States when to check blood glucose levels  Demonstrates proper blood glucose monitoring techniques  States the effect of stress and exercise on blood glucose levels  States the importance of limiting caffeine and abstaining from alcohol and smoking  Plan:  Aim for 3 Carb Choices per meal (45 grams) +/- 1 either way  Aim for 1-2 Carbs per snack Begin reading food labels for Total Carbohydrate of foods Consider  increasing your activity level by walking or other activity daily as tolerated Begin checking BG before breakfast and 2 hours after first bite of breakfast, lunch and dinner as directed by MD  Bring Log Book/Sheet to every medical appointment   Take medication if directed by MD  Blood glucose monitor given: One Touch Verio Lot # [id number]  Exp: 12/28/2018 Blood glucose reading: 97 mg/dl post breakfast  Patient instructed to monitor glucose levels: FBS: 60 - 95 mg/dl 2 hour: <120 mg/dl  Patient received the following handouts:  Nutrition Diabetes and Pregnancy  Carbohydrate Counting List  Patient will be seen for follow-up as needed.

## 2018-01-08 ENCOUNTER — Ambulatory Visit: Payer: BLUE CROSS/BLUE SHIELD | Admitting: *Deleted

## 2018-01-28 DIAGNOSIS — Z713 Dietary counseling and surveillance: Secondary | ICD-10-CM | POA: Diagnosis not present

## 2018-02-15 ENCOUNTER — Encounter: Payer: BLUE CROSS/BLUE SHIELD | Admitting: Family Medicine

## 2018-02-18 DIAGNOSIS — Z3685 Encounter for antenatal screening for Streptococcus B: Secondary | ICD-10-CM | POA: Diagnosis not present

## 2018-02-18 DIAGNOSIS — Z3480 Encounter for supervision of other normal pregnancy, unspecified trimester: Secondary | ICD-10-CM | POA: Diagnosis not present

## 2018-03-08 ENCOUNTER — Encounter (HOSPITAL_COMMUNITY): Payer: Self-pay | Admitting: *Deleted

## 2018-03-08 ENCOUNTER — Inpatient Hospital Stay (HOSPITAL_COMMUNITY)
Admission: AD | Admit: 2018-03-08 | Discharge: 2018-03-08 | Disposition: A | Discharge status: Home / Self Care | Payer: No Typology Code available for payment source | Source: Ambulatory Visit | Attending: Obstetrics and Gynecology | Admitting: Obstetrics and Gynecology

## 2018-03-08 DIAGNOSIS — O479 False labor, unspecified: Secondary | ICD-10-CM

## 2018-03-08 DIAGNOSIS — Z3A38 38 weeks gestation of pregnancy: Secondary | ICD-10-CM | POA: Diagnosis not present

## 2018-03-08 DIAGNOSIS — O26893 Other specified pregnancy related conditions, third trimester: Secondary | ICD-10-CM | POA: Diagnosis not present

## 2018-03-08 NOTE — Discharge Instructions (Signed)

## 2018-03-08 NOTE — MAU Note (Signed)
BP's Reviewed w/ Dr. Gaetano Net and MAU provider. No S/S preeclampsia. Pt's Baseline BP reviewed from prenatal record and found to be 140/70. Pt may be discharged home and to follow up within 2 days at the office per Dr. Gaetano Net.

## 2018-03-08 NOTE — MAU Note (Signed)
Pt has been contraction since 8:00 pm. Contractions are now 4-5 minutes apart for an hour. Denies LOF or bleeding. + FM

## 2018-03-08 NOTE — MAU Note (Signed)
I have communicated with Dr. Gaetano Net and reviewed vital signs:  Vitals:   03/08/18 0238 03/08/18 0239  BP:  (!) 148/72  Pulse:  (!) 50  Resp: 18   Temp: 98.4 F (36.9 C)   SpO2:  100%    Vaginal exam:  Dilation: 1 Effacement (%): 60 Cervical Position: Posterior Station: -3 Presentation: Vertex Exam by:: B Mosca RN,   Also reviewed contraction pattern and that non-stress test is reactive.  It has been documented that patient is contracting every 2-3 minutes rating ctx 2/10 with no cervical change since Wednesday not indicating active labor.  Patient denies any other complaints.  Based on this report provider has given order for discharge.  A discharge order and diagnosis entered by a provider.   Labor discharge instructions reviewed with patient.

## 2018-03-18 ENCOUNTER — Encounter (HOSPITAL_COMMUNITY): Payer: Self-pay

## 2018-03-18 ENCOUNTER — Inpatient Hospital Stay (HOSPITAL_COMMUNITY)
Admission: AD | Admit: 2018-03-18 | Discharge: 2018-03-21 | DRG: 807 | Disposition: A | Discharge status: Home / Self Care | Payer: No Typology Code available for payment source | Attending: Obstetrics and Gynecology | Admitting: Obstetrics and Gynecology

## 2018-03-18 DIAGNOSIS — O139 Gestational [pregnancy-induced] hypertension without significant proteinuria, unspecified trimester: Secondary | ICD-10-CM | POA: Diagnosis present

## 2018-03-18 DIAGNOSIS — Z3A39 39 weeks gestation of pregnancy: Secondary | ICD-10-CM | POA: Diagnosis not present

## 2018-03-18 DIAGNOSIS — O134 Gestational [pregnancy-induced] hypertension without significant proteinuria, complicating childbirth: Secondary | ICD-10-CM | POA: Diagnosis present

## 2018-03-18 DIAGNOSIS — O24420 Gestational diabetes mellitus in childbirth, diet controlled: Principal | ICD-10-CM | POA: Diagnosis present

## 2018-03-18 LAB — COMPREHENSIVE METABOLIC PANEL
ALT: 29 U/L (ref 14–54)
ANION GAP: 9 (ref 5–15)
AST: 29 U/L (ref 15–41)
Albumin: 3.1 g/dL — ABNORMAL LOW (ref 3.5–5.0)
Alkaline Phosphatase: 152 U/L — ABNORMAL HIGH (ref 38–126)
BILIRUBIN TOTAL: 0.5 mg/dL (ref 0.3–1.2)
BUN: 24 mg/dL — AB (ref 6–20)
CO2: 21 mmol/L — ABNORMAL LOW (ref 22–32)
Calcium: 10.8 mg/dL — ABNORMAL HIGH (ref 8.9–10.3)
Chloride: 104 mmol/L (ref 101–111)
Creatinine, Ser: 0.98 mg/dL (ref 0.44–1.00)
Glucose, Bld: 84 mg/dL (ref 65–99)
POTASSIUM: 4.9 mmol/L (ref 3.5–5.1)
Sodium: 134 mmol/L — ABNORMAL LOW (ref 135–145)
TOTAL PROTEIN: 6.7 g/dL (ref 6.5–8.1)

## 2018-03-18 LAB — CBC
HCT: 34.9 % — ABNORMAL LOW (ref 36.0–46.0)
Hemoglobin: 12.2 g/dL (ref 12.0–15.0)
MCH: 31.5 pg (ref 26.0–34.0)
MCHC: 35 g/dL (ref 30.0–36.0)
MCV: 90.2 fL (ref 78.0–100.0)
Platelets: 194 10*3/uL (ref 150–400)
RBC: 3.87 MIL/uL (ref 3.87–5.11)
RDW: 12.9 % (ref 11.5–15.5)
WBC: 11.4 10*3/uL — AB (ref 4.0–10.5)

## 2018-03-18 LAB — TYPE AND SCREEN
ABO/RH(D): B POS
Antibody Screen: NEGATIVE

## 2018-03-18 LAB — ABO/RH: ABO/RH(D): B POS

## 2018-03-18 MED ORDER — LACTATED RINGERS IV SOLN
INTRAVENOUS | Status: DC
  Administered 2018-03-18 – 2018-03-19 (×2): via INTRAVENOUS

## 2018-03-18 MED ORDER — FLEET ENEMA 7-19 GM/118ML RE ENEM: 1.0000 | ENEMA | RECTAL | Status: DC | PRN

## 2018-03-18 MED ORDER — SOD CITRATE-CITRIC ACID 500-334 MG/5ML PO SOLN: 30.0000 mL | ORAL | Status: DC | PRN

## 2018-03-18 MED ORDER — MISOPROSTOL 25 MCG QUARTER TABLET
25.0000 ug | ORAL_TABLET | ORAL | Status: DC | PRN
  Administered 2018-03-18: 25 ug via VAGINAL
  Filled 2018-03-18: qty 1

## 2018-03-18 MED ORDER — ONDANSETRON HCL 4 MG/2ML IJ SOLN: 4.0000 mg | Freq: Four times a day (QID) | INTRAMUSCULAR | Status: DC | PRN

## 2018-03-18 MED ORDER — OXYTOCIN 40 UNITS IN LACTATED RINGERS INFUSION - SIMPLE MED
2.5000 [IU]/h | INTRAVENOUS | Status: DC
  Filled 2018-03-18: qty 1000

## 2018-03-18 MED ORDER — ACETAMINOPHEN 325 MG PO TABS: 650.0000 mg | ORAL_TABLET | ORAL | Status: DC | PRN

## 2018-03-18 MED ORDER — OXYTOCIN BOLUS FROM INFUSION: 500.0000 mL | Freq: Once | INTRAVENOUS | Status: DC

## 2018-03-18 MED ORDER — OXYCODONE-ACETAMINOPHEN 5-325 MG PO TABS: 1.0000 | ORAL_TABLET | ORAL | Status: DC | PRN

## 2018-03-18 MED ORDER — BUTORPHANOL TARTRATE 1 MG/ML IJ SOLN: 1.0000 mg | INTRAMUSCULAR | Status: DC | PRN

## 2018-03-18 MED ORDER — LIDOCAINE HCL (PF) 1 % IJ SOLN
30.0000 mL | INTRAMUSCULAR | Status: DC | PRN
  Filled 2018-03-18: qty 30

## 2018-03-18 MED ORDER — TERBUTALINE SULFATE 1 MG/ML IJ SOLN: 0.2500 mg | Freq: Once | INTRAMUSCULAR | Status: DC | PRN

## 2018-03-18 MED ORDER — LACTATED RINGERS IV SOLN: 500.0000 mL | INTRAVENOUS | Status: DC | PRN

## 2018-03-18 MED ORDER — OXYCODONE-ACETAMINOPHEN 5-325 MG PO TABS: 2.0000 | ORAL_TABLET | ORAL | Status: DC | PRN

## 2018-03-18 NOTE — H&P (Signed)
Kristina Rhodes is a 27 y.o. female presenting for IOL. Today in office BP 140s/96. No CNS changes. Pregnancy also complicated by GDM with diet control. Patient C/O UCs all day. No ROM. OB History    Gravida  1   Para  0   Term  0   Preterm  0   AB  0   Living  0     SAB  0   TAB  0   Ectopic  0   Multiple  0   Live Births             Past Medical History:  Diagnosis Date  . Allergy   . Heart murmur    Past Surgical History:  Procedure Laterality Date  . WISDOM TOOTH EXTRACTION     Family History: family history includes Asthma in her maternal grandmother and mother; Breast cancer (age of onset: 39) in her cousin; Breast cancer (age of onset: 48) in her paternal aunt; Dementia in her maternal grandmother; Heart disease in her paternal grandfather and paternal grandmother; 38 / [place] in her paternal aunt; Multiple sclerosis in her cousin; Skin cancer in her mother. Social History:  reports that she has never smoked. She has never used smokeless tobacco. She reports that she drinks alcohol. She reports that she does not use drugs.     Maternal Diabetes: Yes:  Diabetes Type:  Diet controlled Genetic Screening: Normal Maternal Ultrasounds/Referrals: Normal Fetal Ultrasounds or other Referrals:  None Maternal Substance Abuse:  No Significant Maternal Medications:  None Significant Maternal Lab Results:  None Other Comments:  None  Review of Systems  Eyes: Negative for blurred vision.  Gastrointestinal: Negative for abdominal pain.  Neurological: Negative for headaches.   Maternal Medical History:  Fetal activity: Perceived fetal activity is normal.        Height 5' (1.524 m), weight 150 lb (68 kg). Maternal Exam:  Abdomen: Fetal presentation: vertex     Physical Exam  Cardiovascular: Normal rate and regular rhythm.  Respiratory: Effort normal and breath sounds normal.  GI: Soft. There is no tenderness.  Neurological: She has normal  reflexes.    Cx 2-3/80/-1/vtx  Prenatal labs: ABO, Rh:   Antibody:   Rubella:   RPR:    HBsAg:    HIV:    GBS:     Assessment/Plan: 27 yo G1P0 Gestational Hypertension A1GDM D/W patient 2 stage induction if not contracting too much Check labs D/W patient magnesium sulfate for seizure prophylaxis if diagnosed with preeclampsia   Shon Millet II 03/18/2018, 9:37 PM

## 2018-03-18 NOTE — Progress Notes (Signed)
Pt stated she's here for IOL, called BS charge, Pt's name not on list of IOL. Dr. Gaetano Net in department and stated to direct admit.  Pt admitted to BS rm 173.

## 2018-03-19 ENCOUNTER — Inpatient Hospital Stay (HOSPITAL_COMMUNITY): Payer: No Typology Code available for payment source | Admitting: Anesthesiology

## 2018-03-19 ENCOUNTER — Encounter (HOSPITAL_COMMUNITY): Payer: Self-pay | Admitting: *Deleted

## 2018-03-19 LAB — COMPREHENSIVE METABOLIC PANEL
ALBUMIN: 3.3 g/dL — AB (ref 3.5–5.0)
ALK PHOS: 153 U/L — AB (ref 38–126)
ALT: 30 U/L (ref 14–54)
AST: 31 U/L (ref 15–41)
Anion gap: 10 (ref 5–15)
BILIRUBIN TOTAL: 0.3 mg/dL (ref 0.3–1.2)
BUN: 21 mg/dL — AB (ref 6–20)
CALCIUM: 9.9 mg/dL (ref 8.9–10.3)
CO2: 20 mmol/L — ABNORMAL LOW (ref 22–32)
CREATININE: 0.98 mg/dL (ref 0.44–1.00)
Chloride: 102 mmol/L (ref 101–111)
GFR calc Af Amer: >60 mL/min (ref >60)
GFR calc non Af Amer: >60 mL/min (ref >60)
GLUCOSE: 76 mg/dL (ref 65–99)
Potassium: 4.4 mmol/L (ref 3.5–5.1)
Sodium: 132 mmol/L — ABNORMAL LOW (ref 135–145)
TOTAL PROTEIN: 6.7 g/dL (ref 6.5–8.1)

## 2018-03-19 LAB — URINALYSIS, ROUTINE W REFLEX MICROSCOPIC
Bilirubin Urine: NEGATIVE
Glucose, UA: NEGATIVE mg/dL
Ketones, ur: NEGATIVE mg/dL
NITRITE: NEGATIVE
PROTEIN: NEGATIVE mg/dL
SPECIFIC GRAVITY, URINE: 1.013 (ref 1.005–1.030)
Trans Epithel, UA: <1
pH: 6 (ref 5.0–8.0)

## 2018-03-19 LAB — RPR: RPR Ser Ql: NONREACTIVE

## 2018-03-19 LAB — CBC
HEMATOCRIT: 36.4 % (ref 36.0–46.0)
HEMOGLOBIN: 12.9 g/dL (ref 12.0–15.0)
MCH: 31.9 pg (ref 26.0–34.0)
MCHC: 35.4 g/dL (ref 30.0–36.0)
MCV: 89.9 fL (ref 78.0–100.0)
Platelets: 194 10*3/uL (ref 150–400)
RBC: 4.05 MIL/uL (ref 3.87–5.11)
RDW: 12.9 % (ref 11.5–15.5)
WBC: 12.5 10*3/uL — AB (ref 4.0–10.5)

## 2018-03-19 MED ORDER — TETANUS-DIPHTH-ACELL PERTUSSIS 5-2.5-18.5 LF-MCG/0.5 IM SUSP: 0.5000 mL | Freq: Once | INTRAMUSCULAR | Status: DC

## 2018-03-19 MED ORDER — EPHEDRINE 5 MG/ML INJ: 10.0000 mg | INTRAVENOUS | Status: DC | PRN

## 2018-03-19 MED ORDER — MEDROXYPROGESTERONE ACETATE 150 MG/ML IM SUSP: 150.0000 mg | INTRAMUSCULAR | Status: DC | PRN

## 2018-03-19 MED ORDER — LIDOCAINE HCL (PF) 1 % IJ SOLN
INTRAMUSCULAR | Status: DC | PRN
  Administered 2018-03-19: 13 mL via EPIDURAL

## 2018-03-19 MED ORDER — OXYTOCIN 40 UNITS IN LACTATED RINGERS INFUSION - SIMPLE MED
1.0000 m[IU]/min | INTRAVENOUS | Status: DC
  Administered 2018-03-19: 1 m[IU]/min via INTRAVENOUS

## 2018-03-19 MED ORDER — PRENATAL MULTIVITAMIN CH
1.0000 | ORAL_TABLET | Freq: Every day | ORAL | Status: DC
  Administered 2018-03-20 – 2018-03-21 (×2): 1 via ORAL
  Filled 2018-03-19 (×2): qty 1

## 2018-03-19 MED ORDER — OXYCODONE-ACETAMINOPHEN 5-325 MG PO TABS: 1.0000 | ORAL_TABLET | ORAL | Status: DC | PRN

## 2018-03-19 MED ORDER — ONDANSETRON HCL 4 MG PO TABS: 4.0000 mg | ORAL_TABLET | ORAL | Status: DC | PRN

## 2018-03-19 MED ORDER — ACETAMINOPHEN 325 MG PO TABS
650.0000 mg | ORAL_TABLET | ORAL | Status: DC | PRN
Start: 2018-03-19 — End: 2018-03-21
  Administered 2018-03-19: 650 mg via ORAL
  Filled 2018-03-19: qty 2

## 2018-03-19 MED ORDER — DIPHENHYDRAMINE HCL 25 MG PO CAPS: 25.0000 mg | ORAL_CAPSULE | Freq: Four times a day (QID) | ORAL | Status: DC | PRN

## 2018-03-19 MED ORDER — TERBUTALINE SULFATE 1 MG/ML IJ SOLN: 0.2500 mg | Freq: Once | INTRAMUSCULAR | Status: DC | PRN

## 2018-03-19 MED ORDER — FENTANYL 2.5 MCG/ML BUPIVACAINE 1/10 % EPIDURAL INFUSION (WH - ANES)
14.0000 mL/h | INTRAMUSCULAR | Status: DC | PRN
  Administered 2018-03-19: 14 mL/h via EPIDURAL
  Filled 2018-03-19: qty 100

## 2018-03-19 MED ORDER — PHENYLEPHRINE 40 MCG/ML (10ML) SYRINGE FOR IV PUSH (FOR BLOOD PRESSURE SUPPORT): 80.0000 ug | PREFILLED_SYRINGE | INTRAVENOUS | Status: DC | PRN

## 2018-03-19 MED ORDER — SIMETHICONE 80 MG PO CHEW: 80.0000 mg | CHEWABLE_TABLET | ORAL | Status: DC | PRN

## 2018-03-19 MED ORDER — DIBUCAINE 1 % RE OINT
1.0000 "application " | TOPICAL_OINTMENT | RECTAL | Status: DC | PRN
  Administered 2018-03-19: 1 via RECTAL
  Filled 2018-03-19: qty 28

## 2018-03-19 MED ORDER — MEASLES, MUMPS & RUBELLA VAC ~~LOC~~ INJ
0.5000 mL | INJECTION | Freq: Once | SUBCUTANEOUS | Status: DC
  Filled 2018-03-19: qty 0.5

## 2018-03-19 MED ORDER — DIPHENHYDRAMINE HCL 50 MG/ML IJ SOLN: 12.5000 mg | INTRAMUSCULAR | Status: DC | PRN

## 2018-03-19 MED ORDER — PHENYLEPHRINE 40 MCG/ML (10ML) SYRINGE FOR IV PUSH (FOR BLOOD PRESSURE SUPPORT)
80.0000 ug | PREFILLED_SYRINGE | INTRAVENOUS | Status: DC | PRN
  Filled 2018-03-19: qty 10

## 2018-03-19 MED ORDER — OXYCODONE-ACETAMINOPHEN 5-325 MG PO TABS: 2.0000 | ORAL_TABLET | ORAL | Status: DC | PRN

## 2018-03-19 MED ORDER — LACTATED RINGERS IV SOLN: 500.0000 mL | Freq: Once | INTRAVENOUS | Status: DC

## 2018-03-19 MED ORDER — IBUPROFEN 600 MG PO TABS
600.0000 mg | ORAL_TABLET | Freq: Four times a day (QID) | ORAL | Status: DC
  Administered 2018-03-19 – 2018-03-21 (×8): 600 mg via ORAL
  Filled 2018-03-19 (×8): qty 1

## 2018-03-19 MED ORDER — WITCH HAZEL-GLYCERIN EX PADS: 1.0000 "application " | MEDICATED_PAD | CUTANEOUS | Status: DC | PRN

## 2018-03-19 MED ORDER — SENNOSIDES-DOCUSATE SODIUM 8.6-50 MG PO TABS
2.0000 | ORAL_TABLET | ORAL | Status: DC
  Administered 2018-03-19 – 2018-03-20 (×2): 2 via ORAL
  Filled 2018-03-19 (×2): qty 2

## 2018-03-19 MED ORDER — BENZOCAINE-MENTHOL 20-0.5 % EX AERO
1.0000 "application " | INHALATION_SPRAY | CUTANEOUS | Status: DC | PRN
  Administered 2018-03-19 – 2018-03-21 (×2): 1 via TOPICAL
  Filled 2018-03-19 (×2): qty 56

## 2018-03-19 MED ORDER — ONDANSETRON HCL 4 MG/2ML IJ SOLN: 4.0000 mg | INTRAMUSCULAR | Status: DC | PRN

## 2018-03-19 MED ORDER — COCONUT OIL OIL
1.0000 "application " | TOPICAL_OIL | Status: DC | PRN
  Administered 2018-03-20: 1 via TOPICAL
  Filled 2018-03-19: qty 120

## 2018-03-19 MED ORDER — OXYTOCIN 40 UNITS IN LACTATED RINGERS INFUSION - SIMPLE MED: 1.0000 m[IU]/min | INTRAVENOUS | Status: DC

## 2018-03-19 NOTE — Progress Notes (Signed)
SVD of vigorous female infant  Placenta delivered spontaneous w/ 3VC.   2nd degree lac repaired w/ 3-0 vicryl rapide.  Fundus firm.  EBL 100cc .

## 2018-03-19 NOTE — Lactation Note (Signed)
This note was copied from a baby's chart. Lactation Consultation Note  Patient Name: Kristina Rhodes [id number] Date: 03/19/2018 Reason for consult: Initial assessment;Term;[place] Called to assist mom in labor and delivery.  Newborn is 5 hour old and attempting to latch to breast.  Mom has semi compressible breasts and areolar tissue is thick when compressed.  Baby opens wide and shows good interest in feeding.  She is able to latch and suckle 2-3 times before slipping off.  Hand expression done and a few drops obtained and spoon fed to baby.  Attempted football hold on the left side but that tissue was more difficult to compress.  Shells given with instructions and hand pump to pre pump.  Left baby skin to skin nuzzling at breast.  Reassured mom.  Maternal Data Has patient been taught Hand Expression?: Yes Does the patient have breastfeeding experience prior to this delivery?: No  Feeding    LATCH Score Latch: Repeated attempts needed to sustain latch, nipple held in mouth throughout feeding, stimulation needed to elicit sucking reflex.  Audible Swallowing: None  Type of Nipple: Everted at rest and after stimulation(short)  Comfort (Breast/Nipple): Soft / non-tender  Hold (Positioning): Assistance needed to correctly position infant at breast and maintain latch.  LATCH Score: 6  Interventions Interventions: Assisted with latch;Breast compression;Shells;Skin to skin;Adjust position;Breast massage;Support pillows;Hand express;Position options;Hand pump  Lactation Tools Discussed/Used Tools: Shells;Pump Shell Type: Inverted Breast pump type: Manual   Consult Status      Ave Filter 03/19/2018, 11:54 AM

## 2018-03-19 NOTE — Anesthesia Pain Management Evaluation Note (Signed)
  CRNA Pain Management Visit Note  Patient: Kristina Rhodes, 27 y.o., female  "Hello I am a member of the anesthesia team at [facility]. We have an anesthesia team available at all times to provide care throughout the hospital, including epidural management and anesthesia for C-section. I don't know your plan for the delivery whether it a natural birth, water birth, IV sedation, nitrous supplementation, doula or epidural, but we want to meet your pain goals."   1.Was your pain managed to your expectations on prior hospitalizations?   No prior hospitalizations  2.What is your expectation for pain management during this hospitalization?     Epidural, already placed and working well on visit  3.How can we help you reach that goal? Support PRN Record the patient's initial score and the patient's pain goal.   Pain: 0, pt was at a 8/9 prior to epidural   Pain Goal: 2 [facility] wants you to be able to say your pain was always managed very well.  [name] 03/19/2018

## 2018-03-19 NOTE — Anesthesia Procedure Notes (Signed)
Epidural Patient location during procedure: OB Start time: 03/19/2018 5:48 AM End time: 03/19/2018 6:08 AM  Staffing Anesthesiologist: Lynda Rainwater, MD Performed: anesthesiologist   Preanesthetic Checklist Completed: patient identified, site marked, surgical consent, pre-op evaluation, timeout performed, IV checked, risks and benefits discussed and monitors and equipment checked  Epidural Patient position: sitting Prep: ChloraPrep Patient monitoring: heart rate, cardiac monitor, continuous pulse ox and blood pressure Approach: midline Location: L2-L3 Injection technique: LOR saline  Needle:  Needle type: Tuohy  Needle gauge: 17 G Needle length: 9 cm Needle insertion depth: 5 cm Catheter type: closed end flexible Catheter size: 20 Guage Catheter at skin depth: 9 cm Test dose: negative  Assessment Events: blood not aspirated, injection not painful, no injection resistance, negative IV test and no paresthesia  Additional Notes Reason for block:procedure for pain

## 2018-03-19 NOTE — H&P (Signed)
Dhana Totton is a 27 y.o. female presenting for IOL.  Pregnancy complicated by [facility] & GDM.  No ctx, vb or lof. Good FM.  Comfortable now with epidural  OB History    Gravida  1   Para  0   Term  0   Preterm  0   AB  0   Living  0     SAB  0   TAB  0   Ectopic  0   Multiple  0   Live Births             Past Medical History:  Diagnosis Date  . Allergy   . Heart murmur    Past Surgical History:  Procedure Laterality Date  . WISDOM TOOTH EXTRACTION     Family History: family history includes Asthma in her maternal grandmother and mother; Breast cancer (age of onset: 72) in her cousin; Breast cancer (age of onset: 3) in her paternal aunt; Dementia in her maternal grandmother; Heart disease in her paternal grandfather and paternal grandmother; 62 / [place] in her paternal aunt; Multiple sclerosis in her cousin; Skin cancer in her mother. Social History:  reports that she has never smoked. She has never used smokeless tobacco. She reports that she drinks alcohol. She reports that she does not use drugs.     Maternal Diabetes: Yes:  Diabetes Type:  Diet controlled Genetic Screening: Normal Maternal Ultrasounds/Referrals: Normal Fetal Ultrasounds or other Referrals:  None Maternal Substance Abuse:  No Significant Maternal Medications:  None Significant Maternal Lab Results:  None Other Comments:  None  ROS History Dilation: 3.5 Effacement (%): 80 Station: -2 Exam by:: [employer] RN Blood pressure (!) 112/94, pulse 99, temperature 98.4 F (36.9 C), temperature source Oral, resp. rate 16, height 5' (1.524 m), weight 150 lb 6.4 oz (68.2 kg), SpO2 100 %. Exam Physical Exam  Gen - NAD Abd - gravid, NT EFW 7.5# Ext - NT, no edema Cvx 3.5/80/-2 AROM - clear IUPC placed Prenatal labs: ABO, Rh: --/--/B POS, B POS Performed at [facility], [address] [place], [place] [address]  [phone]/20 2148) Antibody: NEG (06/20 2148) Rubella:  Equivocal (11/15 0000) RPR: Nonreactive (11/15 0000)  HBsAg: Negative (11/15 0000)  HIV: Non-reactive (11/15 0000)  GBS: Negative (11/27 0000)   Assessment/Plan: Admit pitocin   Marylynn Pearson 03/19/2018, 7:54 AM

## 2018-03-19 NOTE — Anesthesia Postprocedure Evaluation (Signed)
Anesthesia Post Note  Patient: Lexicographer  Procedure(s) Performed: AN AD HOC LABOR EPIDURAL     Patient location during evaluation: Mother Baby Anesthesia Type: Epidural Level of consciousness: awake and alert Pain management: pain level controlled Vital Signs Assessment: post-procedure vital signs reviewed and stable Respiratory status: spontaneous breathing, nonlabored ventilation and respiratory function stable Cardiovascular status: stable Postop Assessment: no headache, no backache, epidural receding, no apparent nausea or vomiting, patient able to bend at knees, able to ambulate and adequate PO intake Anesthetic complications: no    Last Vitals:  Vitals:   03/19/18 1234 03/19/18 1354  BP: 121/69 125/75  Pulse: 61 76  Resp:    Temp: 36.7 C (!) 36.3 C  SpO2: 99% 99%    Last Pain:  Vitals:   03/19/18 1354  TempSrc: Axillary  PainSc:    Pain Goal:                 [employer]

## 2018-03-19 NOTE — Anesthesia Preprocedure Evaluation (Signed)
Anesthesia Evaluation  Patient identified by MRN, date of birth, ID band Patient awake    Reviewed: Allergy & Precautions, NPO status , Patient's Chart, lab work & pertinent test results  Airway Mallampati: II  TM Distance: >3 FB Neck ROM: Full    Dental no notable dental hx.    Pulmonary neg pulmonary ROS,    Pulmonary exam normal breath sounds clear to auscultation       Cardiovascular hypertension, negative cardio ROS Normal cardiovascular exam Rhythm:Regular Rate:Normal     Neuro/Psych negative neurological ROS  negative psych ROS   GI/Hepatic negative GI ROS, Neg liver ROS,   Endo/Other  negative endocrine ROS  Renal/GU negative Renal ROS  negative genitourinary   Musculoskeletal negative musculoskeletal ROS (+)   Abdominal   Peds negative pediatric ROS (+)  Hematology negative hematology ROS (+)   Anesthesia Other Findings   Reproductive/Obstetrics negative OB ROS (+) Pregnancy                             Anesthesia Physical Anesthesia Plan  ASA: II  Anesthesia Plan: Epidural   Post-op Pain Management:    Induction:   PONV Risk Score and Plan:   Airway Management Planned:   Additional Equipment:   Intra-op Plan:   Post-operative Plan:   Informed Consent:   Plan Discussed with:   Anesthesia Plan Comments:         Anesthesia Quick Evaluation

## 2018-03-20 LAB — CBC
HEMATOCRIT: 32.7 % — AB (ref 36.0–46.0)
HEMOGLOBIN: 11.2 g/dL — AB (ref 12.0–15.0)
MCH: 31.2 pg (ref 26.0–34.0)
MCHC: 34.3 g/dL (ref 30.0–36.0)
MCV: 91.1 fL (ref 78.0–100.0)
Platelets: 145 10*3/uL — ABNORMAL LOW (ref 150–400)
RBC: 3.59 MIL/uL — ABNORMAL LOW (ref 3.87–5.11)
RDW: 13.1 % (ref 11.5–15.5)
WBC: 11.8 10*3/uL — AB (ref 4.0–10.5)

## 2018-03-20 NOTE — Lactation Note (Addendum)
This note was copied from a baby's chart. Lactation Consultation Note  Patient Name: Kristina Rhodes [id number] Date: 03/20/2018   Formula of choice to supplement has been changed from Similac 19 calorie to Similac 22 calorie formula due to baby being < 6 lbs. Will keep monitoring and update feeding plan tomorrow.  Maternal Data    Feeding Feeding Type: Formula   Interventions    Lactation Tools Discussed/Used     Consult Status      Kristina Rhodes 03/20/2018, 8:06 PM

## 2018-03-20 NOTE — Lactation Note (Signed)
This note was copied from a baby's chart. Lactation Consultation Note  Patient Name: Girl Jackqueline Aquilar UTMLY'Y Date: 03/20/2018 Reason for consult: Follow-up assessment;Infant < 6lbs;1st time breastfeeding;Primapara;Term;Infant weight loss  29 hours FT < 6 pounds female who is now being partially BF and formula fed by her mother, she's a P1. Baby is at 6% weight loss and formula supplementation was necessary. Parents agreeable about it since pros and cons of formula were discussed before making the decision and in this case the benefit will outweigh the risk. Mom has already started pumping with her own Spectra DEBP but still not able to get any volume, just drops, LC showed parents how to finger feed baby colostrum.  Assisted with latch in cross cradle position on both breast, LC came back to the room twice before baby was sleepy the first time, she did not latch, she just had her bath. Mom requested a size # 16 NS since she thinks the size 20 NS may be too big. Baby still didn't latch with NS, she just kept crying until she fell asleep at the breast. Mom voiced that has been an ongoing problem, baby keeps getting sleepy and she won't even latch sometimes, which seems to be perpetuating the poor feeding cycle. The second time, baby was a little more alert, she did a few sucks for just a few seconds but unable to sustain the latch for more than 10-20 seconds at the very most, baby will suck on a finger when LC did suck training but she won't suck at the breast with or without the shield.  After second attempt, LC showed parents how to feed baby formula with a syringe, LC fed baby 5 ml of Similac 19 calorie formula, NT came into the room to take baby's vitals, parents will continue supplementing the remaining volume, formula supplementation guidelines was also discussed. They both verbalized understanding. Mom also requested some coconut oil, RN brought it in the room and LC explained to mom how to use it  prior pumping.  Parents will start supplementing baby with Similac 19 calorie formula 10-20 ml per feeding every 3 hours after every feeding at the breast or sooner if baby is showing hunger cues. Mom will continue pumping every 3 hours and finger feed baby any amount of colostrum. Feeding plan will be reviewed tomorrow to adjust the amounts according the baby's age and weight loss. Both parents aware of [facility] services and will call PRN.   Maternal Data    Feeding Feeding Type: Formula Length of feed: 5 min  Interventions Interventions: Breast feeding basics reviewed;Assisted with latch;Skin to skin;Breast massage;Hand express;Breast compression;Adjust position;Support pillows;Position options;Coconut oil;Shells  Lactation Tools Discussed/Used Tools: Coconut oil;Nipple Shields Nipple shield size: 16   Consult Status Consult Status: Follow-up Date: 03/21/18 Follow-up type: In-patient    Molli Gethers Francene Boyers 03/20/2018, 3:41 PM

## 2018-03-20 NOTE — Progress Notes (Signed)
Post Partum Day 1 Subjective: no complaints  Objective: Blood pressure 140/83, pulse (!) 55, temperature 97.7 F (36.5 C), temperature source Oral, resp. rate 18, height 5' (1.524 m), weight 150 lb 6.4 oz (68.2 kg), SpO2 99 %, unknown if currently breastfeeding.  Physical Exam:  General: alert and cooperative Lochia: appropriate Uterine Fundus: firm Incision: n/a DVT Evaluation: No evidence of DVT seen on physical exam.  Recent Labs    03/19/18 0500 03/20/18 0519  HGB 12.9 11.2*  HCT 36.4 32.7*    Assessment/Plan: Plan for discharge tomorrow and Breastfeeding   LOS: 2 days   Marylynn Pearson 03/20/2018, 9:00 AM

## 2018-03-21 MED ORDER — IBUPROFEN 600 MG PO TABS: 600.0000 mg | ORAL_TABLET | Freq: Four times a day (QID) | ORAL | 0 refills | Status: DC

## 2018-03-21 NOTE — Lactation Note (Signed)
This note was copied from a baby's chart. Lactation Consultation Note  Patient Name: Kristina Rhodes [id number] Date: 03/21/2018 Reason for consult: Follow-up assessment;Infant weight loss;Term;Infant < 6lbs;1st time breastfeeding;Primapara;Difficult latch  Baby is 46 hours old  As LC entered the room, baby lying in crib, and noted to to be gaggy, and LC burped baby and  She burp, no spit up. Old formula spit up in the bed , moderate amount.  LC changed a wet diaper.  Baby rooting, and LC assisted mom to latch and baby latched, no suck with stimulation,  LC recommended STS, and when she is showing more feeding cues to call for LC to assess feeding.  LC plan is to try a SNS with latch since mom is only getting drops with pumping.  Mom has  DEBP Spectra.    Maternal Data Has patient been taught Hand Expression?: Yes(small drops and permom has been pumping )  Feeding Feeding Type: Breast Fed Length of feed: (latched , no sucks )  LATCH Score Latch: Grasps breast easily, tongue down, lips flanged, rhythmical sucking.  Audible Swallowing: None  Type of Nipple: Everted at rest and after stimulation(some areola edema )  Comfort (Breast/Nipple): Soft / non-tender  Hold (Positioning): Assistance needed to correctly position infant at breast and maintain latch.  LATCH Score: 7  Interventions Interventions: Breast feeding basics reviewed;Assisted with latch;Skin to skin;Breast massage;Hand express;Breast compression;Adjust position;Support pillows;Position options  Lactation Tools Discussed/Used WIC Program: No   Consult Status Consult Status: Follow-up Date: 03/21/18 Follow-up type: In-patient    [place] 03/21/2018, 9:24 AM

## 2018-03-21 NOTE — Discharge Summary (Signed)
Obstetric Discharge Summary Reason for Admission: induction of labor Prenatal Procedures: ultrasound Intrapartum Procedures: spontaneous vaginal delivery Postpartum Procedures: none Complications-Operative and Postpartum: none Hemoglobin  Date Value Ref Range Status  03/20/2018 11.2 (L) 12.0 - 15.0 g/dL Final   HCT  Date Value Ref Range Status  03/20/2018 32.7 (L) 36.0 - 46.0 % Final    Physical Exam:  General: alert and cooperative Lochia: appropriate Uterine Fundus: firm Incision: healing well DVT Evaluation: No evidence of DVT seen on physical exam.  Discharge Diagnoses: Term Pregnancy-delivered  Discharge Information: Date: 03/21/2018 Activity: pelvic rest Diet: routine Medications: PNV and Ibuprofen Condition: stable Instructions: refer to practice specific booklet Discharge to: home [facility] Schedule an appointment as soon as possible for a visit in 5 day(s).   Contact information: [place] [address] [phone]           Newborn Data: Live born female  Birth Weight: 5 lb 7.8 oz (2489 g) APGAR: 88, 9  Newborn Delivery   Birth date/time:  03/19/2018 10:36:00 Delivery type:  Vaginal, Spontaneous     Home with mother.  Kristina Rhodes 03/21/2018, 8:13 AM

## 2018-03-21 NOTE — Lactation Note (Signed)
This note was copied from a baby's chart. Lactation Consultation Note  Patient Name: Kristina Rhodes [id number] Date: 03/21/2018 Reason for consult: Follow-up assessment;Difficult latch;Primapara;1st time breastfeeding;Term;Infant < 6lbs;Infant weight loss  Baby is 56 hours old  LC reviewed and updated the doc flow sheets.  2nd [facility] visit this am, baby now awake and hungry.  LC assisted to latch on the right breast / football/ depth achieved and then inserted the 82F SNS  In the side of the mouth. And baby fed for 15 mins and took 24 ml. Baby sustained latched with depth  And per mom comfortable. Baby had a small spit up afterwards.  After baby finished, LC reviewed hand pump, the importance of wearing shells between feedings except when sleeping.  Sore nipple and engorgement prevention and tx reviewed.  Per mom  has DEBP Spectra for home , hand pump from [facility] , shells, and 5 F SNS , 2 sets. ( aware of how to clean all the tools )  Mother informed of post-discharge support and given phone number to the lactation department, including services for phone call assistance; out-patient appointments; and breastfeeding support group. List of other breastfeeding resources in the community given in the handout. Encouraged mother to call for problems or concerns related to breastfeeding. LC plan:  Breast shells between feedings  Prior to latch - have dad prepare 82F and syringes with EBM / and formula if not available.  Prior to latch - breast massage, hand express, prepump if needed , latch with firm support Latch - working on baby to open mouth wide and with breast compressions.  Then have dad insert the 82F SNS in the side of the mouth.  Increase volume of supplementing gradually upto to 30 ml by the end of today.  After feeding post pump both breast for 15 -20 mins / save milk for next feeding.     Maternal Data Has patient been taught Hand Expression?: Yes  Feeding Feeding Type: Formula Length of  feed: 15 min  LATCH Score Latch: Grasps breast easily, tongue down, lips flanged, rhythmical sucking.  Audible Swallowing: Spontaneous and intermittent  Type of Nipple: Everted at rest and after stimulation  Comfort (Breast/Nipple): Soft / non-tender  Hold (Positioning): Assistance needed to correctly position infant at breast and maintain latch.  LATCH Score: 9  Interventions Interventions: Breast feeding basics reviewed;Assisted with latch;Skin to skin;Breast massage;Hand express;Breast compression;Adjust position;Support pillows;Position options;Expressed milk;Shells;Coconut oil;Hand pump  Lactation Tools Discussed/Used Tools: Shells;Pump;Flanges Nipple shield size: 20;24 Flange Size: 27;24 Shell Type: Inverted Breast pump type: Manual WIC Program: No   Consult Status Consult Status: Follow-up Date: 03/21/18 Follow-up type: Out-patient(per mom w/ Raford Pitcher Carder LC at [place] )    [place] 03/21/2018, 10:43 AM

## 2018-05-03 ENCOUNTER — Encounter: Payer: BLUE CROSS/BLUE SHIELD | Admitting: Family Medicine

## 2018-05-04 ENCOUNTER — Encounter: Payer: Self-pay | Admitting: Family Medicine

## 2018-05-04 ENCOUNTER — Ambulatory Visit (INDEPENDENT_AMBULATORY_CARE_PROVIDER_SITE_OTHER): Payer: No Typology Code available for payment source | Admitting: Family Medicine

## 2018-05-04 VITALS — BP 119/81 | HR 87 | Temp 98.1°F | Resp 20 | Ht 60.0 in | Wt 140.0 lb

## 2018-05-04 DIAGNOSIS — O24419 Gestational diabetes mellitus in pregnancy, unspecified control: Secondary | ICD-10-CM

## 2018-05-04 DIAGNOSIS — Z0000 Encounter for general adult medical examination without abnormal findings: Secondary | ICD-10-CM | POA: Diagnosis not present

## 2018-05-04 DIAGNOSIS — Z130 Encounter for screening for diseases of the blood and blood-forming organs and certain disorders involving the immune mechanism: Secondary | ICD-10-CM

## 2018-05-04 DIAGNOSIS — Z13220 Encounter for screening for lipoid disorders: Secondary | ICD-10-CM

## 2018-05-04 DIAGNOSIS — E871 Hypo-osmolality and hyponatremia: Secondary | ICD-10-CM | POA: Diagnosis not present

## 2018-05-04 DIAGNOSIS — Z23 Encounter for immunization: Secondary | ICD-10-CM | POA: Diagnosis not present

## 2018-05-04 LAB — COMPREHENSIVE METABOLIC PANEL
ALK PHOS: 63 U/L (ref 39–117)
ALT: 22 U/L (ref 0–35)
AST: 18 U/L (ref 0–37)
Albumin: 4.5 g/dL (ref 3.5–5.2)
BUN: 17 mg/dL (ref 6–23)
CO2: 28 mEq/L (ref 19–32)
CREATININE: 0.92 mg/dL (ref 0.40–1.20)
Calcium: 11.3 mg/dL — ABNORMAL HIGH (ref 8.4–10.5)
Chloride: 104 mEq/L (ref 96–112)
GFR: 76.6 mL/min (ref >60.00)
Glucose, Bld: 74 mg/dL (ref 70–99)
POTASSIUM: 4.3 meq/L (ref 3.5–5.1)
SODIUM: 137 meq/L (ref 135–145)
TOTAL PROTEIN: 7.3 g/dL (ref 6.0–8.3)
Total Bilirubin: 0.5 mg/dL (ref 0.2–1.2)

## 2018-05-04 LAB — CBC WITH DIFFERENTIAL/PLATELET
BASOS ABS: 0 10*3/uL (ref 0.0–0.1)
BASOS PCT: 0.6 % (ref 0.0–3.0)
Eosinophils Absolute: 0.1 10*3/uL (ref 0.0–0.7)
Eosinophils Relative: 1.2 % (ref 0.0–5.0)
HEMATOCRIT: 37 % (ref 36.0–46.0)
Hemoglobin: 12.8 g/dL (ref 12.0–15.0)
LYMPHS ABS: 2.8 10*3/uL (ref 0.7–4.0)
Lymphocytes Relative: 33.3 % (ref 12.0–46.0)
MCHC: 34.6 g/dL (ref 30.0–36.0)
MCV: 89.4 fl (ref 78.0–100.0)
MONO ABS: 0.6 10*3/uL (ref 0.1–1.0)
Monocytes Relative: 7.5 % (ref 3.0–12.0)
NEUTROS ABS: 4.9 10*3/uL (ref 1.4–7.7)
NEUTROS PCT: 57.4 % (ref 43.0–77.0)
PLATELETS: 275 10*3/uL (ref 150.0–400.0)
RBC: 4.14 Mil/uL (ref 3.87–5.11)
RDW: 12.6 % (ref 11.5–15.5)
WBC: 8.5 10*3/uL (ref 4.0–10.5)

## 2018-05-04 LAB — LIPID PANEL
CHOL/HDL RATIO: 3
Cholesterol: 185 mg/dL (ref 0–200)
HDL: 69.9 mg/dL (ref >39.00)
LDL Cholesterol: 99 mg/dL (ref 0–99)
NONHDL: 114.73
Triglycerides: 79 mg/dL (ref 0.0–149.0)
VLDL: 15.8 mg/dL (ref 0.0–40.0)

## 2018-05-04 LAB — HEMOGLOBIN A1C: Hgb A1c MFr Bld: 5.3 % (ref 4.6–6.5)

## 2018-05-04 NOTE — Patient Instructions (Signed)

## 2018-05-04 NOTE — Progress Notes (Signed)
Patient ID: Kristina Rhodes, female  DOB: 14-Mar-1991, 27 y.o.   MRN: [id number] Patient Care Team    Relationship Specialty Notifications Start End  Ma Hillock, DO PCP - General Family Medicine  03/18/18   Linda Hedges, [place] Physician Obstetrics and Gynecology  02/13/17     Chief Complaint  Patient presents with  . Annual Exam    Subjective:  Kristina Rhodes is a 27 y.o.  Female  present for CPE. All past medical history, surgical history, allergies, family history, immunizations, medications and social history were updatedin the electronic medical record today. All recent labs, ED visits and hospitalizations within the last year were reviewed.  Health maintenance:  Colonoscopy: No Fhx, Screen at 50.  Mammogram: FHX present. Screen at 40, baseline prior.  Cervical cancer screening: has GYN, Dr. Lynnette Caffey UTD 2017 Immunizations: UTD tdap 2018, Influenza yearly encouraged. MMR vaccine is Non-immune at GYN--> completed today.  Infectious disease screening: HIV completed. DEXA: Screen at 58 Assistive device: none Oxygen EHO:ZYYQ Patient has a Dental home. Hospitalizations/ED visits: none   Depression screen [facility] 2/9 05/04/2018 01/06/2018 02/13/2017 02/27/2016  Decreased Interest 0 0 0 0  Down, Depressed, Hopeless 0 0 0 0  PHQ - 2 Score 0 0 0 0   No flowsheet data found.   Current Exercise Habits: Home exercise routine, Time (Minutes): 30, Frequency (Times/Week): 3, Weekly Exercise (Minutes/Week): 90, Intensity: Mild Exercise limited by: None identified   Immunization History  Administered Date(s) Administered  . DTaP 04/20/1989, 07/08/1989, 09/03/1989, 08/30/1990, 06/24/1994  . HPV Quadrivalent 09/16/2004, 11/21/2005, 03/18/2006  . Hepatitis A 09/16/2005, 03/18/2006  . Hepatitis A, Ped/Adol-2 Dose 09/16/2005, 03/18/2006  . Hepatitis B 06/24/1994, 08/06/1994, 01/23/1995  . Hepatitis B, ped/adol 06/24/1994, 08/06/1994, 01/23/1995  . IPV 04/20/1989, 07/08/1989, 08/30/1990,  06/24/1994  . Influenza Split 07/08/1989, 06/29/2013  . Influenza, Seasonal, Injecte, Preservative Fre 06/29/2013  . MMR 05/26/1990, 06/05/1994  . Meningococcal Conjugate 03/18/2006  . Td 12/13/2002  . Tdap 03/22/2007, 02/13/2017  . Varicella 08/06/1994, 03/08/2007     Past Medical History:  Diagnosis Date  . Allergy   . Heart murmur    Allergies  Allergen Reactions  . Amoxicillin Hives    Has patient had a PCN reaction causing immediate rash, facial/tongue/throat swelling, SOB or lightheadedness with hypotension: Yes Has patient had a PCN reaction causing severe rash involving mucus membranes or skin necrosis: No Has patient had a PCN reaction that required hospitalization: No Has patient had a PCN reaction occurring within the last 10 years: yes If all of the above answers are "NO", then may proceed with Cephalosporin use.  Marland Kitchen Keflex [Cephalexin] Hives  . Penicillins Hives    Has patient had a PCN reaction causing immediate rash, facial/tongue/throat swelling, SOB or lightheadedness with hypotension: Yes Has patient had a PCN reaction causing severe rash involving mucus membranes or skin necrosis: No Has patient had a PCN reaction that required hospitalization: No Has patient had a PCN reaction occurring within the last 10 years: yes If all of the above answers are "NO", then may proceed with Cephalosporin use.  . Cephalosporins Rash   Past Surgical History:  Procedure Laterality Date  . VAGINAL DELIVERY    . WISDOM TOOTH EXTRACTION     Family History  Problem Relation Age of Onset  . Skin cancer Mother   . Asthma Mother   . Breast cancer Paternal Aunt 34  . Heart disease Paternal Grandmother   . Breast cancer Cousin 110  .  Dementia Maternal Grandmother   . Asthma Maternal Grandmother   . Multiple sclerosis Cousin   . Miscarriages / Stillbirths Paternal Aunt   . Heart disease Paternal Grandfather    Social History   Socioeconomic History  . Marital status:  Married    Spouse name: Not on file  . Number of children: Not on file  . Years of education: Not on file  . Highest education level: Not on file  Occupational History  . Not on file  Social Needs  . Financial resource strain: Not on file  . Food insecurity:    Worry: Not on file    Inability: Not on file  . Transportation needs:    Medical: Not on file    Non-medical: Not on file  Tobacco Use  . Smoking status: Never Smoker  . Smokeless tobacco: Never Used  Substance and Sexual Activity  . Alcohol use: Yes    Comment: occasional  . Drug use: No  . Sexual activity: Yes    Birth control/protection: None  Lifestyle  . Physical activity:    Days per week: Not on file    Minutes per session: Not on file  . Stress: Not on file  Relationships  . Social connections:    Talks on phone: Not on file    Gets together: Not on file    Attends religious service: Not on file    Active member of club or organization: Not on file    Attends meetings of clubs or organizations: Not on file    Relationship status: Not on file  . Intimate partner violence:    Fear of current or ex partner: Not on file    Emotionally abused: Not on file    Physically abused: Not on file    Forced sexual activity: Not on file  Other Topics Concern  . 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    Drinks caffeine. Herbal remedy use. Daily vitamin use.    Wears her seatbelt and bicycle helmet.    Exercises routinely.    Regular diet.    Smoke detector in the home. No firearms in the home.    Feels safe in her relationships.    Allergies as of 05/04/2018      Reactions   Amoxicillin Hives   Has patient had a PCN reaction causing immediate rash, facial/tongue/throat swelling, SOB or lightheadedness with hypotension: Yes Has patient had a PCN reaction causing severe rash involving mucus membranes or skin necrosis: No Has patient had a PCN  reaction that required hospitalization: No Has patient had a PCN reaction occurring within the last 10 years: yes If all of the above answers are "NO", then may proceed with Cephalosporin use.   Keflex [cephalexin] Hives   Penicillins Hives   Has patient had a PCN reaction causing immediate rash, facial/tongue/throat swelling, SOB or lightheadedness with hypotension: Yes Has patient had a PCN reaction causing severe rash involving mucus membranes or skin necrosis: No Has patient had a PCN reaction that required hospitalization: No Has patient had a PCN reaction occurring within the last 10 years: yes If all of the above answers are "NO", then may proceed with Cephalosporin use.   Cephalosporins Rash      Medication List        Accurate as of 05/04/18  9:18 AM. Always use your most recent med list.  docusate sodium 100 MG capsule Commonly known as:  COLACE Take 100 mg by mouth daily as needed for mild constipation.   prenatal multivitamin Tabs tablet Take 1 tablet by mouth daily at 12 noon.       All past medical history, surgical history, allergies, family history, immunizations andmedications were updated in the EMR today and reviewed under the history and medication portions of their EMR.     Patient was never admitted.   ROS: 14 pt review of systems performed and negative (unless mentioned in an HPI)  Objective: BP 119/81 (BP Location: Right Arm, Patient Position: Sitting, Cuff Size: Normal)   Pulse 87   Temp 98.1 F (36.7 C)   Resp 20   Ht 5' (1.524 m)   Wt 140 lb (63.5 kg)   SpO2 99%   Breastfeeding? Yes   BMI 27.34 kg/m  Gen: Afebrile. No acute distress. Nontoxic in appearance. Well developed, well nourished, recent vag delivery.  HENT: AT. [place]. Bilateral TM visualized and normal in appearance. MMM. Bilateral nares without erythema, or drainage. Throat without erythema or exudates. No cough or hoarseness.  Eyes:Pupils Equal Round Reactive to light,  Extraocular movements intact,  Conjunctiva without redness, discharge or icterus. Neck/lymp/endocrine: Supple,no lymphadenopathy, no thyromegaly CV: RRR no murmur, no edema, +2/4 P posterior tibialis pulses Chest: CTAB, no wheeze or crackles Abd: Soft. flat. NTND. BS present. no Masses palpated.  MSK: No erythema or swelling. FROM. No obvious deformities. NV intact Skin: no rashes, purpura or petechiae.  Neuro:  Normal gait. PERLA. EOMi. Alert. Oriented. Cranial nerves II through XII intact. Muscle strength 5/5 B-U/L extremity. DTRs equal bilaterally. Psych: Normal affect, dress and demeanor. Normal speech. Normal thought content and judgment..   No exam data present  Assessment/plan: Kristina Rhodes is a 27 y.o. female present for CPE. Encounter for preventive health examination Patient was encouraged to exercise greater than 150 minutes a week. Patient was encouraged to choose a diet filled with fresh fruits and vegetables, and lean meats. AVS provided to patient today for education/recommendation on gender specific health and safety maintenance. Colonoscopy: No Fhx, Screen at 50.  Mammogram: FHX present. Screen at 40, baseline prior.  Cervical cancer screening: has GYN, Dr. Lynnette Caffey UTD 2017 Immunizations: UTD tdap 2018, Influenza yearly encouraged. MMR vaccine is Non-immune at GYN--> completed today.  Infectious disease screening: HIV completed. DEXA: Screen at 51 Screening for deficiency anemia - CBC w/Diff Lipid screening - Lipid panel Hyponatremia - Comp Met (CMET) history of Gestational diabetes mellitus (GDM), antepartum, gestational diabetes method of control unspecified - HgB A1c Need for MMR vaccine MMR non-immune/eq at GYN--> delivered 03/19/2018-->MMR provided today.     Return in about 1 year (around 05/05/2019) for CPE.  Electronically signed by: Howard Pouch, DO [place]

## 2018-05-06 ENCOUNTER — Telehealth: Payer: Self-pay | Admitting: *Deleted

## 2018-05-06 ENCOUNTER — Telehealth: Payer: Self-pay | Admitting: Family Medicine

## 2018-05-06 HISTORY — DX: Hypercalcemia: E83.52

## 2018-05-06 NOTE — Telephone Encounter (Signed)
Please inform patient the following information: Her labs are normal with the exception of her calcium level is elevated. She has had very mild elevation once in the past that resolved. - This could be high if she is taking a calcium supplement and/or she is not drinking enough water. If taking extra calcium--> stop. She may need to continue the PNV if breast feeding.   - HYDRATE at least 100 ounces of water a day (especially if she is breastfeeding- may need more).  Repeat by lab appt only in 2 weeks (order has been placed). If abnormal again, would need to see in person and discuss options.

## 2018-05-06 NOTE — Telephone Encounter (Signed)
Result note/message read to patient; verbalizes understanding. States she is not taking calcium supplement; she is taking PNVs, states is staying hydrated. F/U lab scheduled for 05/28/18 as pt unable to make appt the week of 05/20/18.

## 2018-05-06 NOTE — Telephone Encounter (Signed)
Message left on voice mail for patient to return call. Okay for [facility] nurse to give information and pcp recommendations.

## 2018-05-19 ENCOUNTER — Other Ambulatory Visit: Payer: Self-pay | Admitting: Sports Medicine

## 2018-05-19 DIAGNOSIS — S43002A Unspecified subluxation of left shoulder joint, initial encounter: Secondary | ICD-10-CM

## 2018-05-28 ENCOUNTER — Other Ambulatory Visit (INDEPENDENT_AMBULATORY_CARE_PROVIDER_SITE_OTHER): Payer: No Typology Code available for payment source

## 2018-05-28 ENCOUNTER — Telehealth: Payer: Self-pay | Admitting: Family Medicine

## 2018-05-28 LAB — VITAMIN D 25 HYDROXY (VIT D DEFICIENCY, FRACTURES): VITD: 27.2 ng/mL — ABNORMAL LOW (ref 30.00–100.00)

## 2018-05-28 NOTE — Telephone Encounter (Signed)
Form completed and faxed. 

## 2018-05-28 NOTE — Telephone Encounter (Signed)
Left message for patient

## 2018-05-28 NOTE — Telephone Encounter (Signed)
Patient dropped on biometric screening form today at the time of her lab appt.  She is requesting form to be completed and faxed to [employer], [phone].  Charge sheet generated, attached to form and placed in md folder at front desk.

## 2018-06-01 ENCOUNTER — Telehealth: Payer: Self-pay | Admitting: Family Medicine

## 2018-06-01 LAB — PTH, INTACT AND CALCIUM
Calcium: 11.6 mg/dL — ABNORMAL HIGH (ref 8.6–10.2)
PTH: 45 pg/mL (ref 14–64)

## 2018-06-01 NOTE — Telephone Encounter (Signed)
Please inform patient the following information: Her calcium is even higher than prior collection at 11.6. I have referred her to endocrinology to further evaluate. In the meantime she should maintain hydration with at least 80-100 ounces of water a day.  If develop sSigns of hypercalcemia she is to go to ED for IV fluids and treatment-->Loss of appetite, Nausea and vomiting., Constipation and abdominal pain. Increased thirst and frequent urination. Fatigue, weakness, and muscle pain. Confusion, disorientation, and difficulty thinking. Headaches. Depression.

## 2018-06-02 NOTE — Telephone Encounter (Signed)
Message left on voice mail for patient to return call.  Okay for [facility] nurse to give results and recommendations.

## 2018-06-02 NOTE — Telephone Encounter (Signed)
Pt. Given results and instructions. Verbalizes understanding. Will try hard to stay hydrated.

## 2018-07-02 ENCOUNTER — Ambulatory Visit
Admission: RE | Admit: 2018-07-02 | Discharge: 2018-07-02 | Disposition: A | Discharge status: Home / Self Care | Payer: Worker's Compensation | Source: Ambulatory Visit | Attending: Sports Medicine | Admitting: Sports Medicine

## 2018-07-02 DIAGNOSIS — S43002A Unspecified subluxation of left shoulder joint, initial encounter: Secondary | ICD-10-CM

## 2018-07-02 MED ORDER — IOPAMIDOL (ISOVUE-M 200) INJECTION 41%
1.0000 mL | Freq: Once | INTRAMUSCULAR | Status: AC
  Administered 2018-07-02: 12 mL via INTRA_ARTICULAR

## 2018-07-09 ENCOUNTER — Encounter: Payer: Self-pay | Admitting: Internal Medicine

## 2018-07-09 ENCOUNTER — Ambulatory Visit (INDEPENDENT_AMBULATORY_CARE_PROVIDER_SITE_OTHER): Payer: PRIVATE HEALTH INSURANCE | Admitting: Internal Medicine

## 2018-07-09 LAB — MAGNESIUM: Magnesium: 2.2 mg/dL (ref 1.5–2.5)

## 2018-07-09 LAB — PHOSPHORUS: Phosphorus: 2.9 mg/dL (ref 2.3–4.6)

## 2018-07-09 NOTE — Patient Instructions (Signed)
Please stop at the lab.  Please stop prenatal vitamins and start vitamins without calcium.  Please come back for a follow-up appointment in 4 months.

## 2018-07-09 NOTE — Progress Notes (Addendum)
Patient ID: Kristina Rhodes, female   DOB: 11-Dec-1990, 27 y.o.   MRN: [id number]    HPI  Kristina Rhodes is a 27 y.o.-year-old female, referred by her PCP, Dr. Raoul Pitch, for evaluation for hypercalcemia with normal PTH developed at the end of her pregnancy.  Pt was dx with hypercalcemia in 02/2018 - the day she was induced. I reviewed pt's pertinent labs: Lab Results  Component Value Date   PTH 45 05/28/2018   CALCIUM 11.6 (H) 05/28/2018   CALCIUM 11.3 (H) 05/04/2018   CALCIUM 9.9 03/19/2018   CALCIUM 10.8 (H) 03/18/2018   CALCIUM 10.3 02/13/2017  05/04/2015: Calcium 9.9 12/22/2013: Calcium 9.8  No fractures as an adult (2 wrist fx - as a child).  No h/o kidney stones.  No h/o CKD. Last BUN/Cr: Lab Results  Component Value Date   BUN 17 05/04/2018   BUN 21 (H) 03/19/2018   CREATININE 0.92 05/04/2018   CREATININE 0.98 03/19/2018   Pt is not on HCTZ.  She was on labetalol at the end of the pregnancy.  + Vitamin D insufficiency. Reviewed vit D levels: Lab Results  Component Value Date   VD25OH 27.20 (L) 05/28/2018   Pt is on calcium and vitamin D - prenatal MVI.   Pt does not have a FH of thyroid cancer, but does have + FH of OP.  She mentions her paternal cousin had a parathyroid tumor and kidney stones >> s/p resection.  Pt. also has a history of GDM and HTN during and after pregnancy.  She mentions that she had a large battery of genetic tests performed because of her Jewish ancestry and this was essentially negative for any mutation.  ROS: Constitutional: no weight gain/loss, + fatigue, no subjective hyperthermia/hypothermia, + increased appetite Eyes: + Blurry vision, no xerophthalmia ENT: no sore throat, no nodules palpated in throat, no dysphagia/odynophagia, no hoarseness Cardiovascular: no CP/SOB/palpitations/leg swelling Respiratory: no cough/SOB Gastrointestinal: no N/V/D/C Musculoskeletal: no muscle/joint aches Skin: no rashes Neurological: no  tremors/numbness/tingling/dizziness, + headache Psychiatric: no depression/anxiety  Past Medical History:  Diagnosis Date  . Allergy   . Heart murmur    Past Surgical History:  Procedure Laterality Date  . VAGINAL DELIVERY    . WISDOM TOOTH EXTRACTION     Social History   Socioeconomic History  . Marital status: Married    Spouse name: Not on file  . Number of children: 1  . Years of education: Not on file  . Highest education level: Not on file  Occupational History  . Not on file  Tobacco Use  . Smoking status: Never Smoker  . Smokeless tobacco: Never Used  Substance and Sexual Activity  . Alcohol use: Yes    Comment: occasional  . Drug use: No  . 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    Drinks caffeine. Herbal remedy use. Daily vitamin use.    Wears her seatbelt and bicycle helmet.    Exercises routinely.    Regular diet.    Smoke detector in the home. No firearms in the home.    Feels safe in her relationships.    Current Outpatient Medications on File Prior to Visit  Medication Sig Dispense Refill  . Prenatal Vit-Fe Fumarate-FA (PRENATAL MULTIVITAMIN) TABS tablet Take 1 tablet by mouth daily at 12 noon.    . docusate sodium (COLACE) 100 MG capsule Take 100 mg by mouth  daily as needed for mild constipation.      No current facility-administered medications on file prior to visit.    Allergies  Allergen Reactions  . Amoxicillin Hives    Has patient had a PCN reaction causing immediate rash, facial/tongue/throat swelling, SOB or lightheadedness with hypotension: Yes Has patient had a PCN reaction causing severe rash involving mucus membranes or skin necrosis: No Has patient had a PCN reaction that required hospitalization: No Has patient had a PCN reaction occurring within the last 10 years: yes If all of the above answers are "NO",  then may proceed with Cephalosporin use.  Marland Kitchen Keflex [Cephalexin] Hives  . Penicillins Hives    Has patient had a PCN reaction causing immediate rash, facial/tongue/throat swelling, SOB or lightheadedness with hypotension: Yes Has patient had a PCN reaction causing severe rash involving mucus membranes or skin necrosis: No Has patient had a PCN reaction that required hospitalization: No Has patient had a PCN reaction occurring within the last 10 years: yes If all of the above answers are "NO", then may proceed with Cephalosporin use.  . Cephalosporins Rash   Family History  Problem Relation Age of Onset  . Skin cancer Mother   . Asthma Mother   . Breast cancer Paternal Aunt 12  . Heart disease Paternal Grandmother   . Breast cancer Cousin 63  . Dementia Maternal Grandmother   . Asthma Maternal Grandmother   . Multiple sclerosis Cousin   . Miscarriages / Stillbirths Paternal Aunt   . Heart disease Paternal Grandfather     PE: BP 122/70   Pulse 77   Ht 5' (1.524 m)   Wt 138 lb (62.6 kg)   SpO2 99%   Breastfeeding? Yes   BMI 26.95 kg/m  Wt Readings from Last 3 Encounters:  07/09/18 138 lb (62.6 kg)  05/04/18 140 lb (63.5 kg)  03/18/18 150 lb 6.4 oz (68.2 kg)   Constitutional: Normal weight, in NAD. Eyes: PERRLA, EOMI, no exophthalmos ENT: moist mucous membranes, no thyromegaly, no cervical lymphadenopathy Cardiovascular: RRR, No MRG Respiratory: CTA B Gastrointestinal: abdomen soft, NT, ND, BS+ Musculoskeletal: no deformities, strength intact in all 4 Skin: moist, warm, no rashes Neurological: no tremor with outstretched hands, DTR normal in all 4  Assessment: 1. Hypercalcemia/hyperparathyroidism  Plan: Patient has had several instances of elevated calcium, with the highest level being at 11.6. A corresponding intact PTH level was not suppressed, at 45 - Patient also  has vitamin D insufficiency, with the last level being 27.2 in 04/2018.  - No apparent complications  from hypercalcemia: no h/o nephrolithiasis, no osteoporosis, no fractures. No abdominal pain, depression, bone pain. - I explained the physiology of calcium and parathyroid hormone, and possible side effects from increased PTH, including kidney stones, osteoporosis, abdominal pain, etc.  - We discussed that we need to check whether her hyperparathyroidism is primary (Familial hypercalcemic hypocalciuria or parathyroid adenoma) or secondary (to conditions like: vitamin D deficiency, calcium malabsorption, hypercalciuria, renal insufficiency, etc.).  We also discussed that during pregnancy and also during lactation, the placenta and the mammary glands produce PTHrp, which occasionally are secreting a high enough quantities to cause hypercalcemia.  This usually resolves after pregnancy, or, after breast-feeding.  She plans to breast-feed at least until 10/2018.  She tells me that she is producing a lot of milk, approximately double the quantity that her daughter eats.  They are freezing the milk but they are running out of space in the freezer. - She had a  slightly low vitamin D but I do not feel that this is low enough to cause PTH or calcium abnormality. - At today's visit, we will check: calcium level intact PTH ([facility]) PTHrp Magnesium Phosphorus Calcitriol After the results return, we may need a 24h urinary calcium/creatinine ratio - given instructions for urine collection - We discussed possible consequences of hyperparathyroidism: ~1/3 pts will develop complications over 15 years (OP, nephrolithiasis).  - After we clarify the etiology, if she does turn out to have primary hyperparathyroidism, she may need a referral to surgery.  Since she is so young, she may need genetic investigation. - Criteria for parathyroid surgery are:  . Increased calcium by more than 1 mg/dL above the upper limit of normal  . Kidney ds.  . Osteoporosis (or vertebral fracture) . Age <110 years old Newer criteria  (2013): Marland Kitchen High UCa >400 mg/d and increased stone risk by biochemical stone risk analysis . Presence of nephrolithiasis or nephrocalcinosis . Pt's preference!  - We may need to check a DXA scan to see if she has osteoporosis (+ add a 33% distal radius for evaluation of cortical bone, which is predominantly affected by hyperparathyroidism).  - For now, I advised her to stop the prenatal vitamins as they contain calcium and start a multivitamin without calcium.  She may need to supplement folic acid.  However, I advised her not to limit calcium from the diet. - I will see the patient back in 4 months  Component     Latest Ref Rng & Units 07/09/2018  Calcium     8.7 - 10.2 mg/dL 11.4 (H)  PTH, Intact     15 - 65 pg/mL 39  PTH Interp      Comment  Vitamin D 1, 25 (OH) Total     18 - 72 pg/mL 71  Vitamin D3 1, 25 (OH)     pg/mL 71  Vitamin D2 1, 25 (OH)     pg/mL <8  Phosphorus     2.3 - 4.6 mg/dL 2.9  Magnesium     1.5 - 2.5 mg/dL 2.2  PTH-Related Protein (PTH-RP)     14 - 27 pg/mL 16  Calcium Ionized     4.8 - 5.6 mg/dL 6.1 (H)   Calcitriol level is normal, as is her phosphorus, magnesium, and also her PTHrp. Labs point towards primary hyperparathyroidism.  I will ask her to do a 24-hour urine collection for calcium.  This is still pending. I will addend the results when they become available.  Addendum 10/28/2018: Normal 24-hour urine calcium: Component     Latest Ref Rng & Units 10/26/2018  Calcium, 24H Urine     mg/24 h 90  Creatinine, 24H Ur     0.50 - 2.15 g/24 h 1.13   We discussed with patient if she is still breast-feeding.  My suggestion is to wait until she finishes this for further investigation.  If she stops breast-feeding, will need to wait at least a month and then I will suggest to check a technetium sestamibi scan.  Philemon Kingdom, MD PhD [facility] Endocrinology

## 2018-07-10 LAB — PTH, INTACT AND CALCIUM
CALCIUM: 11.4 mg/dL — AB (ref 8.7–10.2)
PTH: 39 pg/mL (ref 15–65)

## 2018-07-16 LAB — VITAMIN D 1,25 DIHYDROXY
VITAMIN D 1, 25 (OH) TOTAL: 71 pg/mL (ref 18–72)
Vitamin D2 1, 25 (OH)2: <8 pg/mL
Vitamin D3 1, 25 (OH)2: 71 pg/mL

## 2018-07-16 LAB — CALCIUM, IONIZED: CALCIUM ION: 6.1 mg/dL — AB (ref 4.8–5.6)

## 2018-07-16 LAB — PTH-RELATED PEPTIDE: PTH-Related Protein (PTH-RP): 16 pg/mL (ref 14–27)

## 2018-07-19 ENCOUNTER — Encounter: Payer: Self-pay | Admitting: Internal Medicine

## 2018-10-21 ENCOUNTER — Encounter: Payer: Self-pay | Admitting: Family Medicine

## 2018-10-25 ENCOUNTER — Encounter: Payer: Self-pay | Admitting: Internal Medicine

## 2018-10-26 ENCOUNTER — Other Ambulatory Visit (INDEPENDENT_AMBULATORY_CARE_PROVIDER_SITE_OTHER): Payer: PRIVATE HEALTH INSURANCE

## 2018-10-28 LAB — CREATININE, URINE, 24 HOUR: Creatinine, 24H Ur: 1.13 g/(24.h) (ref 0.50–2.15)

## 2018-10-28 LAB — CALCIUM, URINE, 24 HOUR: Calcium, 24H Urine: 90 mg/24 h

## 2018-11-10 ENCOUNTER — Ambulatory Visit: Payer: Self-pay | Admitting: Internal Medicine

## 2018-12-05 ENCOUNTER — Encounter: Payer: Self-pay | Admitting: Internal Medicine

## 2018-12-14 ENCOUNTER — Encounter: Payer: Self-pay | Admitting: Family Medicine

## 2018-12-15 ENCOUNTER — Encounter: Payer: Self-pay | Admitting: Family Medicine

## 2018-12-31 ENCOUNTER — Ambulatory Visit: Payer: Self-pay | Admitting: Internal Medicine

## 2019-04-21 ENCOUNTER — Other Ambulatory Visit: Payer: Self-pay

## 2019-04-21 ENCOUNTER — Ambulatory Visit (INDEPENDENT_AMBULATORY_CARE_PROVIDER_SITE_OTHER): Payer: BC Managed Care – PPO | Admitting: Internal Medicine

## 2019-04-21 ENCOUNTER — Encounter: Payer: Self-pay | Admitting: Internal Medicine

## 2019-04-21 DIAGNOSIS — D351 Benign neoplasm of parathyroid gland: Secondary | ICD-10-CM

## 2019-04-21 DIAGNOSIS — E559 Vitamin D deficiency, unspecified: Secondary | ICD-10-CM

## 2019-04-21 LAB — VITAMIN D 25 HYDROXY (VIT D DEFICIENCY, FRACTURES): VITD: 27.75 ng/mL — ABNORMAL LOW (ref 30.00–100.00)

## 2019-04-21 NOTE — Progress Notes (Signed)
Patient ID: Kristina Rhodes, female   DOB: [birth year], 28 y.o.   MRN: [id number]    HPI  Kristina Rhodes is a 28 y.o.-year-old female, initially referred by her PCP, Dr. Raoul Pitch, returning for follow-up for hypercalcemia with normal PTH developed at the end of her pregnancy.  Last visit 9 months ago.  She stopped b'feeding in 01/2019. She plans to start trying for another pregnancy next year.  Pt was diagnosed with hypercalcemia in 02/2018, today she was induced.   I reviewed pt's pertinent labs: Lab Results  Component Value Date   PTH 39 07/09/2018   PTH Comment 07/09/2018   PTH 45 05/28/2018   CALCIUM 11.4 (H) 07/09/2018   CALCIUM 11.6 (H) 05/28/2018   CALCIUM 11.3 (H) 05/04/2018   CALCIUM 9.9 03/19/2018   CALCIUM 10.8 (H) 03/18/2018   CALCIUM 10.3 02/13/2017  05/04/2015: Calcium 9.9 12/22/2013: Calcium 9.8  No fractures as an adult (2 wrist fx - as a child).  No history of kidney stones.  No history of CKD. Last BUN/Cr: Lab Results  Component Value Date   BUN 17 05/04/2018   BUN 21 (H) 03/19/2018   CREATININE 0.92 05/04/2018   CREATININE 0.98 03/19/2018   She has a history of vitamin D insufficiency.  Reviewed her vitamin D levels: Lab Results  Component Value Date   VD25OH 27.20 (L) 05/28/2018   She stopped prenatal vitamins 06/2019.  At last visit, we investigated her hypercalcemia further:  Her calcitriol, PTH-rp, magnesium, phosphorus were all normal.  Ionized calcium was high. Component     Latest Ref Rng & Units 07/09/2018  Calcium     8.7 - 10.2 mg/dL 11.4 (H)  PTH, Intact     15 - 65 pg/mL 39  PTH Interp      Comment  Vitamin D 1, 25 (OH) Total     18 - 72 pg/mL 71  Vitamin D3 1, 25 (OH)     pg/mL 71  Vitamin D2 1, 25 (OH)     pg/mL <8  Phosphorus     2.3 - 4.6 mg/dL 2.9  Magnesium     1.5 - 2.5 mg/dL 2.2  PTH-Related Protein (PTH-RP)     14 - 27 pg/mL 16   She had a normal 24-hour urine calcium: Component     Latest Ref Rng & Units 10/26/2018   Calcium, 24H Urine     mg/24 h 90  Creatinine, 24H Ur     0.50 - 2.15 g/24 h 1.13   Labs did not point towards primary hyperparathyroidism.  Since she was breast-feeding at last visit, we discussed about waiting at least a month after she finishes breast-feeding and then repeat evaluation.  Not on HCTZ.  She was on labetalol at the end of the pregnancy.  Pt does not have a FH of thyroid cancer, but does have + FH of OP.  She mentions her paternal cousin had a parathyroid tumor and kidney stones >> s/p resection.  Pt. also has a history of GDM and HTN during and after pregnancy.  She had a large battery of genetic tests performed because of her Jewish ancestry and this was essentially negative for any pathologic mutation.  ROS: Constitutional: no weight gain/no weight loss, no fatigue, no subjective hyperthermia, no subjective hypothermia Eyes: no blurry vision, no xerophthalmia ENT: no sore throat, no nodules palpated in neck, no dysphagia, no odynophagia, no hoarseness Cardiovascular: no CP/no SOB/no palpitations/no leg swelling Respiratory: no cough/no SOB/no wheezing Gastrointestinal: no N/no V/no  D/no C/no acid reflux Musculoskeletal: no muscle aches/no joint aches Skin: no rashes, no hair loss Neurological: no tremors/no numbness/no tingling/no dizziness  I reviewed pt's medications, allergies, PMH, social hx, family hx, and changes were documented in the history of present illness. Otherwise, unchanged from my initial visit note.  Past Medical History:  Diagnosis Date  . Allergy   . Heart murmur    Past Surgical History:  Procedure Laterality Date  . VAGINAL DELIVERY    . WISDOM TOOTH EXTRACTION     Social History   Socioeconomic History  . Marital status: Married    Spouse name: Not on file  . Number of children: 1  . Years of education: Not on file  . Highest education level: Not on file  Occupational History  . Not on file  Tobacco Use  . Smoking status:  Never Smoker  . Smokeless tobacco: Never Used  Substance and Sexual Activity  . Alcohol use: Yes    Comment: occasional  . Drug use: No  . 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    Drinks caffeine. Herbal remedy use. Daily vitamin use.    Wears her seatbelt and bicycle helmet.    Exercises routinely.    Regular diet.    Smoke detector in the home. No firearms in the home.    Feels safe in her relationships.    Current Outpatient Medications on File Prior to Visit  Medication Sig Dispense Refill  . docusate sodium (COLACE) 100 MG capsule Take 100 mg by mouth daily as needed for mild constipation.     . Prenatal Vit-Fe Fumarate-FA (PRENATAL MULTIVITAMIN) TABS tablet Take 1 tablet by mouth daily at 12 noon.     No current facility-administered medications on file prior to visit.    Allergies  Allergen Reactions  . Amoxicillin Hives    Has patient had a PCN reaction causing immediate rash, facial/tongue/throat swelling, SOB or lightheadedness with hypotension: Yes Has patient had a PCN reaction causing severe rash involving mucus membranes or skin necrosis: No Has patient had a PCN reaction that required hospitalization: No Has patient had a PCN reaction occurring within the last 10 years: yes If all of the above answers are "NO", then may proceed with Cephalosporin use.  Marland Kitchen Keflex [Cephalexin] Hives  . Penicillins Hives    Has patient had a PCN reaction causing immediate rash, facial/tongue/throat swelling, SOB or lightheadedness with hypotension: Yes Has patient had a PCN reaction causing severe rash involving mucus membranes or skin necrosis: No Has patient had a PCN reaction that required hospitalization: No Has patient had a PCN reaction occurring within the last 10 years: yes If all of the above answers are "NO", then may proceed with Cephalosporin use.   . Cephalosporins Rash   Family History  Problem Relation Age of Onset  . Skin cancer Mother   . Asthma Mother   . Breast cancer Paternal Aunt 54  . Heart disease Paternal Grandmother   . Breast cancer Cousin 64  . Dementia Maternal Grandmother   . Asthma Maternal Grandmother   . Multiple sclerosis Cousin   . Miscarriages / Stillbirths Paternal Aunt   . Heart disease Paternal Grandfather     PE: BP 120/70   Pulse (!) 111   Ht 5' (1.524 m)   Wt 125 lb (56.7 kg)   LMP 04/10/2019   SpO2  98%   Breastfeeding No   BMI 24.41 kg/m  Wt Readings from Last 3 Encounters:  04/21/19 125 lb (56.7 kg)  07/09/18 138 lb (62.6 kg)  05/04/18 140 lb (63.5 kg)   Constitutional: Normal weight, in NAD Eyes: PERRLA, EOMI, no exophthalmos ENT: moist mucous membranes, no thyromegaly, no cervical lymphadenopathy Cardiovascular: tachycardia, RR, No MRG Respiratory: CTA B Gastrointestinal: abdomen soft, NT, ND, BS+ Musculoskeletal: no deformities, strength intact in all 4 Skin: moist, warm, no rashes Neurological: no tremor with outstretched hands, DTR normal in all 4  Assessment: 1. Hypercalcemia/hyperparathyroidism  2.  Vitamin D insufficiency  Plan: Patient has had several instances of elevated calcium, with the highest level being at 11.6. A corresponding intact PTH level was not suppressed, at 45 - Patient also  has vitamin D insufficiency, with the last level being 27.2 in 04/2018.   -At last visit, I advised her to stop prenatal vitamins which contain calcium and start a multivitamin without calcium.  I also advised her that she may need to supplement folic acid. -She has no apparent complications from hypercalcemia, no history of nephrolithiasis, osteoporosis, fractures, abdominal pain, depression, bone pain. -I explained at last visit the physiology of calcium and parathyroid hormone and possible long-term side effects from hyperparathyroidism including kidney stones  and  osteoporosis. -We reviewed the investigation obtained at last visit and this pointed towards primary hyperparathyroidism.  Since she is still young, she may need genetic investigation for this we did discuss that during pregnancy and also during lactation, placental and the mammary glands can produce PTH RP, which occasionally are secreting enough quantities to cause hypercalcemia.  This usually resolves after pregnancy or after breast-feeding.  However, her PTH RP level was normal. -Now that she finished breast-feeding, I will repeat the investigation and if this still points towards primary hyperparathyroidism, I may need to send her to surgery.  She does meet criteria for surgery: . Increased calcium by more than 1 mg/dL above the upper limit of normal  . Kidney ds.  . Osteoporosis (or vertebral fracture) . Age <51 years old Newer criteria (2013): Marland Kitchen High UCa >400 mg/d and increased stone risk by biochemical stone risk analysis . Presence of nephrolithiasis or nephrocalcinosis . Pt's preference!  -she is planning another pregnancy >> will need to do surgery this year if she needs parathyroidectomy -At this visit, we will check the following labs: Ionized calcium, BMP, PTH, vitamin D, and, if vitamin D level is normal, I will also ask her to repeat the urine collection. - I will see her back in 4 mo  2.  Vitamin D insufficiency -We checked her vitamin D at last visit and this was under the lower limit of normal -We continued the multivitamin  but I advised her to switch to without calcium >> she tells me she stopped the prenatal vitamins completely after our last OV -Recheck level today  - time spent with the patient: 25 min, of which >50% was spent in obtaining information about her symptoms, reviewing her previous labs, evaluations, and treatments, counseling her about her condition (please see the discussed topics above), and developing a plan to further investigate and treat it; she had a  number of questions which I addressed.  Office Visit on 04/21/2019  Component Date Value Ref Range Status  . VITD 04/21/2019 27.75* 30.00 - 100.00 ng/mL Final  . Glucose, Bld 04/21/2019 81  65 - 99 mg/dL Final   Comment: .  Fasting reference interval .   . BUN 04/21/2019 19  7 - 25 mg/dL Final  . Creat 04/21/2019 0.88  0.50 - 1.10 mg/dL Final  . GFR, Est Non African American 04/21/2019 88  > OR = 60 mL/min/1.37m2 Final  . GFR, Est African American 04/21/2019 102  > OR = 60 mL/min/1.74m2 Final  . BUN/Creatinine Ratio 94/76/5465 NOT APPLICABLE  6 - 22 (calc) Final  . Sodium 04/21/2019 138  135 - 146 mmol/L Final  . Potassium 04/21/2019 4.5  3.5 - 5.3 mmol/L Final  . Chloride 04/21/2019 101  98 - 110 mmol/L Final  . CO2 04/21/2019 24  20 - 32 mmol/L Final  . Calcium 04/21/2019 11.4* 8.6 - 10.2 mg/dL Final  . PTH 04/21/2019 34  15 - 65 pg/mL Final  . Calcium, Ion 04/21/2019 5.8* 4.8 - 5.6 mg/dL Final   Ionized calcium is slightly lower than but still above the upper limit of normal but PTH is not suppressed.  Vitamin D slightly low.  At this point I would like to go ahead with a technetium sestamibi scan to look at the parathyroids.  Philemon Kingdom, MD PhD [facility] Endocrinology

## 2019-04-21 NOTE — Patient Instructions (Signed)
Please stop at the lab.  Please come back for a follow-up appointment in 4 months.   

## 2019-04-22 LAB — CALCIUM, IONIZED: Calcium, Ion: 5.8 mg/dL — ABNORMAL HIGH (ref 4.8–5.6)

## 2019-04-22 LAB — PARATHYROID HORMONE, INTACT (NO CA): PTH: 34 pg/mL (ref 15–65)

## 2019-04-22 LAB — BASIC METABOLIC PANEL WITH GFR
BUN: 19 mg/dL (ref 7–25)
CO2: 24 mmol/L (ref 20–32)
Calcium: 11.4 mg/dL — ABNORMAL HIGH (ref 8.6–10.2)
Chloride: 101 mmol/L (ref 98–110)
Creat: 0.88 mg/dL (ref 0.50–1.10)
GFR, Est African American: 102 mL/min/{1.73_m2} (ref >=60)
GFR, Est Non African American: 88 mL/min/{1.73_m2} (ref >=60)
Glucose, Bld: 81 mg/dL (ref 65–99)
Potassium: 4.5 mmol/L (ref 3.5–5.3)
Sodium: 138 mmol/L (ref 135–146)

## 2019-05-06 ENCOUNTER — Other Ambulatory Visit: Payer: Self-pay

## 2019-05-06 ENCOUNTER — Encounter: Payer: Self-pay | Admitting: Family Medicine

## 2019-05-06 ENCOUNTER — Ambulatory Visit (INDEPENDENT_AMBULATORY_CARE_PROVIDER_SITE_OTHER): Payer: BC Managed Care – PPO | Admitting: Family Medicine

## 2019-05-06 VITALS — BP 105/71 | HR 77 | Temp 97.0°F | Resp 17 | Ht 61.0 in | Wt 125.4 lb

## 2019-05-06 DIAGNOSIS — Z131 Encounter for screening for diabetes mellitus: Secondary | ICD-10-CM

## 2019-05-06 DIAGNOSIS — Z0000 Encounter for general adult medical examination without abnormal findings: Secondary | ICD-10-CM

## 2019-05-06 DIAGNOSIS — R7989 Other specified abnormal findings of blood chemistry: Secondary | ICD-10-CM | POA: Insufficient documentation

## 2019-05-06 DIAGNOSIS — Z13220 Encounter for screening for lipoid disorders: Secondary | ICD-10-CM | POA: Diagnosis not present

## 2019-05-06 DIAGNOSIS — Z130 Encounter for screening for diseases of the blood and blood-forming organs and certain disorders involving the immune mechanism: Secondary | ICD-10-CM | POA: Diagnosis not present

## 2019-05-06 LAB — CBC
HCT: 37.5 % (ref 36.0–46.0)
Hemoglobin: 12.7 g/dL (ref 12.0–15.0)
MCHC: 33.7 g/dL (ref 30.0–36.0)
MCV: 89.1 fl (ref 78.0–100.0)
Platelets: 222 10*3/uL (ref 150.0–400.0)
RBC: 4.21 Mil/uL (ref 3.87–5.11)
RDW: 12.6 % (ref 11.5–15.5)
WBC: 6.4 10*3/uL (ref 4.0–10.5)

## 2019-05-06 LAB — LIPID PANEL
Cholesterol: 160 mg/dL (ref 0–200)
HDL: 65.6 mg/dL (ref >39.00)
LDL Cholesterol: 86 mg/dL (ref 0–99)
NonHDL: 94.12
Total CHOL/HDL Ratio: 2
Triglycerides: 43 mg/dL (ref 0.0–149.0)
VLDL: 8.6 mg/dL (ref 0.0–40.0)

## 2019-05-06 LAB — TSH: TSH: 2.62 u[IU]/mL (ref 0.35–4.50)

## 2019-05-06 LAB — T4, FREE: Free T4: 0.86 ng/dL (ref 0.60–1.60)

## 2019-05-06 LAB — HEMOGLOBIN A1C: Hgb A1c MFr Bld: 5.2 % (ref 4.6–6.5)

## 2019-05-06 NOTE — Telephone Encounter (Signed)
Printed out Proof of physical form, it has been filled out, once signed by Dr Raoul Pitch will fax and scan into chart.

## 2019-05-06 NOTE — Patient Instructions (Signed)
Health Maintenance, Female Adopting a healthy lifestyle and getting preventive care are important in promoting health and wellness. Ask your health care provider about:  The right schedule for you to have regular tests and exams.  Things you can do on your own to prevent diseases and keep yourself healthy. What should I know about diet, weight, and exercise? Eat a healthy diet   Eat a diet that includes plenty of vegetables, fruits, low-fat dairy products, and lean protein.  Do not eat a lot of foods that are high in solid fats, added sugars, or sodium. Maintain a healthy weight Body mass index (BMI) is used to identify weight problems. It estimates body fat based on height and weight. Your health care provider can help determine your BMI and help you achieve or maintain a healthy weight. Get regular exercise Get regular exercise. This is one of the most important things you can do for your health. Most adults should:  Exercise for at least 150 minutes each week. The exercise should increase your heart rate and make you sweat (moderate-intensity exercise).  Do strengthening exercises at least twice a week. This is in addition to the moderate-intensity exercise.  Spend less time sitting. Even light physical activity can be beneficial. Watch cholesterol and blood lipids Have your blood tested for lipids and cholesterol at 30 years of age, then have this test every 5 years. Have your cholesterol levels checked more often if:  Your lipid or cholesterol levels are high.  You are older than 30 years of age.  You are at high risk for heart disease. What should I know about cancer screening? Depending on your health history and family history, you may need to have cancer screening at various ages. This may include screening for:  Breast cancer.  Cervical cancer.  Colorectal cancer.  Skin cancer.  Lung cancer. What should I know about heart disease, diabetes, and high blood  pressure? Blood pressure and heart disease  High blood pressure causes heart disease and increases the risk of stroke. This is more likely to develop in people who have high blood pressure readings, are of African descent, or are overweight.  Have your blood pressure checked: ? Every 3-5 years if you are 18-39 years of age. ? Every year if you are 40 years old or older. Diabetes Have regular diabetes screenings. This checks your fasting blood sugar level. Have the screening done:  Once every three years after age 40 if you are at a normal weight and have a low risk for diabetes.  More often and at a younger age if you are overweight or have a high risk for diabetes. What should I know about preventing infection? Hepatitis B If you have a higher risk for hepatitis B, you should be screened for this virus. Talk with your health care provider to find out if you are at risk for hepatitis B infection. Hepatitis C Testing is recommended for:  Everyone born from 1945 through 1965.  Anyone with known risk factors for hepatitis C. Sexually transmitted infections (STIs)  Get screened for STIs, including gonorrhea and chlamydia, if: ? You are sexually active and are younger than 30 years of age. ? You are older than 30 years of age and your health care provider tells you that you are at risk for this type of infection. ? Your sexual activity has changed since you were last screened, and you are at increased risk for chlamydia or gonorrhea. Ask your health care provider if   you are at risk.  Ask your health care provider about whether you are at high risk for HIV. Your health care provider may recommend a prescription medicine to help prevent HIV infection. If you choose to take medicine to prevent HIV, you should first get tested for HIV. You should then be tested every 3 months for as long as you are taking the medicine. Pregnancy  If you are about to stop having your period (premenopausal) and  you may become pregnant, seek counseling before you get pregnant.  Take 400 to 800 micrograms (mcg) of folic acid every day if you become pregnant.  Ask for birth control (contraception) if you want to prevent pregnancy. Osteoporosis and menopause Osteoporosis is a disease in which the bones lose minerals and strength with aging. This can result in bone fractures. If you are 65 years old or older, or if you are at risk for osteoporosis and fractures, ask your health care provider if you should:  Be screened for bone loss.  Take a calcium or vitamin D supplement to lower your risk of fractures.  Be given hormone replacement therapy (HRT) to treat symptoms of menopause. Follow these instructions at home: Lifestyle  Do not use any products that contain nicotine or tobacco, such as cigarettes, e-cigarettes, and chewing tobacco. If you need help quitting, ask your health care provider.  Do not use street drugs.  Do not share needles.  Ask your health care provider for help if you need support or information about quitting drugs. Alcohol use  Do not drink alcohol if: ? Your health care provider tells you not to drink. ? You are pregnant, may be pregnant, or are planning to become pregnant.  If you drink alcohol: ? Limit how much you use to 0-1 drink a day. ? Limit intake if you are breastfeeding.  Be aware of how much alcohol is in your drink. In the [place] one drink equals one 12 oz bottle of beer (355 mL), one 5 oz glass of wine (148 mL), or one 1 oz glass of hard liquor (44 mL). General instructions  Schedule regular health, dental, and eye exams.  Stay current with your vaccines.  Tell your health care provider if: ? You often feel depressed. ? You have ever been abused or do not feel safe at home. Summary  Adopting a healthy lifestyle and getting preventive care are important in promoting health and wellness.  Follow your health care provider's instructions about healthy  diet, exercising, and getting tested or screened for diseases.  Follow your health care provider's instructions on monitoring your cholesterol and blood pressure. This information is not intended to replace advice given to you by your health care provider. Make sure you discuss any questions you have with your health care provider. Document Released: 03/31/2011 Document Revised: 09/08/2018 Document Reviewed: 09/08/2018 Elsevier Patient Education  2020 [employer] Inc.  

## 2019-05-06 NOTE — Progress Notes (Signed)
Patient ID: Kristina Rhodes, female  DOB: [birth year], 28 y.o.   MRN: [id number] Patient Care Team    Relationship Specialty Notifications Start End  Ma Hillock, DO PCP - General Family Medicine  03/18/18   Linda Hedges, DO Consulting Physician Obstetrics and Gynecology  02/13/17     Chief Complaint  Patient presents with   Annual Exam    Fasting. Pap smear Scheduled for Sept 2020.     Subjective:  Kristina Rhodes is a 28 y.o.  Female  present for CPE. All past medical history, surgical history, allergies, family history, immunizations, medications and social history were updated in the electronic medical record today. All recent labs, ED visits and hospitalizations within the last year were reviewed.  Health maintenance:  Colonoscopy: No Fhx, Screen at 50.  Mammogram: FHX present. Screen at 40, baseline prior.  Cervical cancer screening: has GYN, Dr. Lynnette Caffey UTD 2017>> has scheduled for 05/2019 Immunizations: UTD tdap 2018, Influenza yearly encouraged.  Infectious disease screening: HIV completed. DEXA: Screen at 58 Assistive device: none Oxygen [id number]:[id number] Patient has a Dental home. Hospitalizations/ED visits: reviewed   Depression screen [facility] 2/9 05/06/2019 05/04/2018 01/06/2018 02/13/2017 02/27/2016  Decreased Interest 0 0 0 0 0  Down, Depressed, Hopeless 0 0 0 0 0  PHQ - 2 Score 0 0 0 0 0   No flowsheet data found.         Immunization History  Administered Date(s) Administered   DTaP 04/20/1989, 07/08/1989, 09/03/1989, 08/30/1990, 06/24/1994   HPV Quadrivalent 09/16/2004, 11/21/2005, 03/18/2006   Hepatitis A 09/16/2005, 03/18/2006   Hepatitis A, Ped/Adol-2 Dose 09/16/2005, 03/18/2006   Hepatitis B 06/24/1994, 08/06/1994, 01/23/1995   Hepatitis B, ped/adol 06/24/1994, 08/06/1994, 01/23/1995   IPV 04/20/1989, 07/08/1989, 08/30/1990, 06/24/1994   Influenza Split 07/08/1989, 06/29/2013   Influenza, Seasonal, Injecte, Preservative Fre 06/29/2013    Influenza,inj,Quad PF,6+ Mos 07/18/2018   MMR 05/26/1990, 06/05/1994, 05/04/2018   Meningococcal Conjugate 03/18/2006   Td 12/13/2002   Tdap 03/22/2007, 02/13/2017   Varicella 08/06/1994, 03/08/2007     Past Medical History:  Diagnosis Date   Allergy    Heart murmur    Allergies  Allergen Reactions   Amoxicillin Hives    Has patient had a PCN reaction causing immediate rash, facial/tongue/throat swelling, SOB or lightheadedness with hypotension: Yes Has patient had a PCN reaction causing severe rash involving mucus membranes or skin necrosis: No Has patient had a PCN reaction that required hospitalization: No Has patient had a PCN reaction occurring within the last 10 years: yes If all of the above answers are "NO", then may proceed with Cephalosporin use.   Keflex [Cephalexin] Hives   Penicillins Hives    Has patient had a PCN reaction causing immediate rash, facial/tongue/throat swelling, SOB or lightheadedness with hypotension: Yes Has patient had a PCN reaction causing severe rash involving mucus membranes or skin necrosis: No Has patient had a PCN reaction that required hospitalization: No Has patient had a PCN reaction occurring within the last 10 years: yes If all of the above answers are "NO", then may proceed with Cephalosporin use.   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    Drinks  caffeine. Herbal remedy use. Daily vitamin use.    Wears her seatbelt and bicycle helmet.    Exercises routinely.    Regular diet.    Smoke detector in the home. No firearms in the home.    Feels safe in her relationships.     Allergies as of 05/06/2019      Reactions   Amoxicillin Hives   Has patient had a PCN reaction causing immediate rash, facial/tongue/throat swelling, SOB or lightheadedness with hypotension: Yes Has patient had a PCN reaction causing severe rash involving mucus membranes or skin necrosis: No Has patient had a PCN reaction that required hospitalization: No Has patient had a PCN reaction occurring within the last 10 years: yes If all of the above answers are "NO", then may proceed with Cephalosporin use.   Keflex [cephalexin] Hives   Penicillins Hives   Has patient had a PCN reaction causing immediate rash, facial/tongue/throat swelling, SOB or lightheadedness with hypotension: Yes Has patient had a PCN reaction causing severe rash involving mucus membranes or skin necrosis: No Has patient had a PCN reaction that required hospitalization: No Has patient had a PCN reaction occurring within the last 10 years: yes If all of the above answers are "NO", then may proceed with Cephalosporin use.   Cephalosporins Rash      Medication List    as of May 06, 2019  8:24 AM   You have not been prescribed any medications.     All past medical history, surgical history, allergies, family history, immunizations andmedications were updated in the EMR today and reviewed under the history and medication portions of their EMR.     Recent Results (from the past 2160 hour(s))  VITAMIN D 25 Hydroxy (Vit-D Deficiency, Fractures)     Status: Abnormal   Collection Time: 04/21/19  9:34 AM  Result Value Ref Range   VITD 27.75 (L) 30.00 - 100.00 ng/mL  BASIC METABOLIC PANEL WITH GFR     Status: Abnormal   Collection Time: 04/21/19  9:34 AM  Result Value Ref Range   Glucose, Bld 81  65 - 99 mg/dL    Comment: .            Fasting reference interval .    BUN 19 7 - 25 mg/dL   Creat 0.88 0.50 - 1.10 mg/dL   GFR, Est Non African American 88 > OR = 60 mL/min/1.65m   GFR, Est African American 102 > OR = 60 mL/min/1.77m  BUN/Creatinine Ratio NOT APPLICABLE 6 - 22 (calc)   Sodium 138 135 - 146 mmol/L   Potassium 4.5 3.5 - 5.3 mmol/L   Chloride 101 98 - 110 mmol/L   CO2 24 20 - 32 mmol/L   Calcium 11.4 (H) 8.6 - 10.2 mg/dL  Parathyroid hormone, intact (no Ca)     Status: None   Collection Time: 04/21/19  9:34 AM  Result Value Ref Range   PTH 34 15 - 65 pg/mL  Calcium, ionized     Status: Abnormal   Collection Time: 04/21/19  9:34 AM  Result Value Ref Range   Calcium, Ion 5.8 (H) 4.8 - 5.6 mg/dL  Mr Shoulder Left W Contrast  Result Date: 07/02/2018 CLINICAL DATA:  Left shoulder pain. Dull pain and limited range of motion. EXAM: MR ARTHROGRAM OF THE LEFT SHOULDER TECHNIQUE: Multiplanar, multisequence MR imaging of the left shoulder was performed following the administration of intra-articular contrast. CONTRAST:  See Injection Documentation. COMPARISON:  None. FINDINGS: Rotator cuff: Mild tendinosis of the supraspinatus tendon without a tear. Infraspinatus tendon is intact. Teres minor tendon is intact. Subscapularis tendon is intact. Muscles: No atrophy or fatty replacement of nor abnormal signal within, the muscles of the rotator cuff. Biceps long head: Intact. Acromioclavicular Joint: Normal acromioclavicular joint. Type IV acromion. No subacromial/subdeltoid bursal fluid or contrast. Glenohumeral Joint: Intraarticular contrast distending the joint capsule. No chondral defect. Normal glenohumeral ligaments. Labrum: Posterior labral tear extending into the inferior labrum. Bones: Abnormal concavity of the superior posterolateral humeral head consistent with a remote Hill-Sachs lesion. No acute osseous abnormality. No aggressive osseous lesion. IMPRESSION: 1. Mild  tendinosis of the supraspinatus tendon without a tear. 2. Posterior labral tear extending into the inferior labrum. 3. Abnormal concavity of the superior posterolateral humeral head consistent with a remote Hill-Sachs lesion. Electronically Signed   By: Kathreen Devoid   On: 07/02/2018 16:09   Dg Fluoro Guided Needle Plc Aspiration/injection Loc  Result Date: 07/02/2018 CLINICAL DATA:  LEFT shoulder pain. FLUOROSCOPY TIME:  12 seconds corresponding to a Dose Area Product of 10.77 Gy*m2 PROCEDURE: LEFT SHOULDER INJECTION UNDER FLUOROSCOPY Informed written consent was obtained.  Time-out was performed. An appropriate skin entrance site was determined. The site was marked, prepped with Betadine, draped in the usual sterile fashion, and infiltrated locally with 1% lidocaine. 22 gauge spinal needle was advanced to the superomedial margin of the humeral head under intermittent fluoroscopy. 1 ml of Lidocaine injected easily. A mixture of 0.1 ml Multihance and 20 ml of dilute Isovue M 200 was then used to opacify the LEFT shoulder capsule. No immediate complication. IMPRESSION: Technically successful LEFT shoulder injection for MRI. Electronically Signed   By: Staci Righter M.D.   On: 07/02/2018 15:22     ROS: 14 pt review of systems performed and negative (unless mentioned in an HPI)  Objective: BP 105/71 (BP Location: Left Arm, Patient Position: Sitting, Cuff Size: Normal)    Pulse 77    Temp (!) 97 F (36.1 C) (Temporal)    Resp 17    Ht _0  (1.549 m)    Wt 125 lb 6 oz (56.9 kg)    LMP 04/10/2019    SpO2 100%    BMI 23.69 kg/m  Gen: Afebrile. No acute distress. Nontoxic in appearance, well-developed, well-nourished,  Pleasant caucasian female.  HENT: AT. . Bilateral TM visualized and normal in appearance, normal external auditory canal. MMM, no oral lesions, adequate dentition. Bilateral nares within normal limits. Throat without erythema, ulcerations or exudates. no Cough on exam, no hoarseness on  exam. Eyes:Pupils Equal Round Reactive to light, Extraocular movements intact,  Conjunctiva without redness, discharge or icterus. Neck/lymp/endocrine: Supple,no lymphadenopathy, no thyromegaly CV: RRR no murmur, no edema, +2/4 P posterior tibialis pulses. no carotid bruits. No JVD. Chest: CTAB, no wheeze, rhonchi or crackles. normal Respiratory effort. good Air movement. Abd: Soft. flat. NTND. BS present. no Masses palpated. No hepatosplenomegaly. No rebound tenderness or guarding. Skin: no rashes, purpura or petechiae. Warm and well-perfused. Skin intact. Neuro/Msk:  Normal gait. PERLA. EOMi. Alert. Oriented x3.  Cranial nerves II through XII intact. Muscle strength 5/5 upper/lower extremity. DTRs equal bilaterally. Psych: Normal affect,  dress and demeanor. Normal speech. Normal thought content and judgment.   No exam data present  Assessment/plan: Kristina Rhodes is a 28 y.o. female present for CPE Screening for deficiency anemia - CBC Diabetes mellitus screening - Hemoglobin A1c Screening cholesterol level - Lipid panel Abnormal TSH/Hypercalcemia - TSH - T4, free Encounter for preventive health examination Patient was encouraged to exercise greater than 150 minutes a week. Patient was encouraged to choose a diet filled with fresh fruits and vegetables, and lean meats. AVS provided to patient today for education/recommendation on gender specific health and safety maintenance. Colonoscopy: No Fhx, Screen at 50.  Mammogram: FHX present. Screen at 40, baseline prior.  Cervical cancer screening: has GYN, Dr. Lynnette Caffey UTD 2017>> has scheduled for 05/2019 Immunizations: UTD tdap 2018, Influenza yearly encouraged.  Infectious disease screening: HIV completed. DEXA: Screen at 60   Return in about 1 year (around 05/05/2020) for CPE (30 min).  Electronically signed by: Howard Pouch, DO [place]

## 2019-05-30 ENCOUNTER — Encounter: Payer: Self-pay | Admitting: Internal Medicine

## 2019-06-22 NOTE — Telephone Encounter (Signed)
Patient called requesting to speak with Colletta [place] in regards to her insurance stating she is now in the system. Please call patient tomorrow afternoon.  Thanks

## 2019-06-22 NOTE — Progress Notes (Unsigned)
Patient states that a prior Josem Kaufmann will be needed.

## 2019-06-28 DIAGNOSIS — Z01419 Encounter for gynecological examination (general) (routine) without abnormal findings: Secondary | ICD-10-CM | POA: Diagnosis not present

## 2019-06-28 DIAGNOSIS — Z6824 Body mass index (BMI) 24.0-24.9, adult: Secondary | ICD-10-CM | POA: Diagnosis not present

## 2019-07-07 ENCOUNTER — Telehealth: Payer: Self-pay

## 2019-07-07 NOTE — Telephone Encounter (Signed)
Called patient to see if she needs her form for her employee wellness that we filled out for her. It was completed back in 04/2018. I found it cleaning out the patient pick up folder.

## 2019-07-19 ENCOUNTER — Telehealth: Payer: Self-pay | Admitting: Internal Medicine

## 2019-07-19 NOTE — Telephone Encounter (Signed)
She probably wants [place] to send the records to Dr. Oran Rein.  If you can find a more information about Dr. Oran Rein, please send him my last note, which should contain everything.

## 2019-07-19 NOTE — Telephone Encounter (Signed)
Not quite sure if we need to do anything?

## 2019-07-19 NOTE — Telephone Encounter (Signed)
Kristina Rhodes ph# [phone] called re: Kristina Rhodes self referred to Dr. Oran Rein who does not do nuclear parathyroid scan. Kristina Rhodes cancelled the appointment. Kristina Rhodes will be requesting records from Dr. Cruzita Lederer to be sent to Dr. Oran Rein.

## 2019-07-20 ENCOUNTER — Encounter (HOSPITAL_COMMUNITY): Admission: RE | Admit: 2019-07-20 | Payer: BC Managed Care – PPO | Source: Ambulatory Visit

## 2019-07-20 ENCOUNTER — Encounter (HOSPITAL_COMMUNITY): Payer: BC Managed Care – PPO

## 2019-08-08 ENCOUNTER — Encounter: Payer: Self-pay | Admitting: Family Medicine

## 2019-08-12 DIAGNOSIS — E210 Primary hyperparathyroidism: Secondary | ICD-10-CM | POA: Diagnosis not present

## 2019-08-12 DIAGNOSIS — Z6823 Body mass index (BMI) 23.0-23.9, adult: Secondary | ICD-10-CM | POA: Diagnosis not present

## 2019-08-17 DIAGNOSIS — Z20828 Contact with and (suspected) exposure to other viral communicable diseases: Secondary | ICD-10-CM | POA: Diagnosis not present

## 2019-08-22 ENCOUNTER — Ambulatory Visit: Payer: BC Managed Care – PPO | Admitting: Internal Medicine

## 2019-09-09 ENCOUNTER — Ambulatory Visit (INDEPENDENT_AMBULATORY_CARE_PROVIDER_SITE_OTHER): Payer: BC Managed Care – PPO | Admitting: Family Medicine

## 2019-09-09 ENCOUNTER — Other Ambulatory Visit: Payer: Self-pay

## 2019-09-09 ENCOUNTER — Encounter: Payer: Self-pay | Admitting: Family Medicine

## 2019-09-09 VITALS — BP 126/67 | Wt 126.0 lb

## 2019-09-09 DIAGNOSIS — L255 Unspecified contact dermatitis due to plants, except food: Secondary | ICD-10-CM | POA: Diagnosis not present

## 2019-09-09 MED ORDER — PREDNISONE 20 MG PO TABS: ORAL_TABLET | ORAL | 0 refills | Status: DC

## 2019-09-09 NOTE — Progress Notes (Signed)
   VIRTUAL VISIT VIA VIDEO  I connected with Kristina Rhodes on 09/09/19 at  4:00 PM EST by a video enabled telemedicine application and verified that I am speaking with the correct person using two identifiers. Location patient: Home Location provider: [facility], Office Persons participating in the virtual visit: Patient, Dr. Raoul Pitch and R.Baker, LPN  I discussed the limitations of evaluation and management by telemedicine and the availability of in person appointments. The patient expressed understanding and agreed to proceed.   SUBJECTIVE Chief Complaint  Patient presents with  . Rash    HPI: Kristina Rhodes is a 28 y.o. female present for new onset rash.  Patient reports she noted to breakout and poison ivy a little over a week ago.  She did not realize initially it was poison ivy, and then realized her husband and brother did cut down some poison ivy vines.  She reports a rash on her arms back chest.  She is due to have surgery Monday on her parathyroids and is fearful her rash will spread up to her neck.  ROS: See pertinent positives and negatives per HPI.  Patient Active Problem List   Diagnosis Date Noted  . Abnormal TSH 05/06/2019  . Hypercalcemia 05/06/2018  . Heart murmur 02/27/2016    Social History   Tobacco Use  . Smoking status: Never Smoker  . Smokeless tobacco: Never Used  Substance Use Topics  . Alcohol use: Yes    Comment: occasional    Current Outpatient Medications:  .  predniSONE (DELTASONE) 20 MG tablet, 60 mg x3d, 40 mg x3d, 20 mg x2d, 10 mg x2d, Disp: 21 tablet, Rfl: 0  Allergies  Allergen Reactions  . Amoxicillin Hives    Has patient had a PCN reaction causing immediate rash, facial/tongue/throat swelling, SOB or lightheadedness with hypotension: Yes Has patient had a PCN reaction causing severe rash involving mucus membranes or skin necrosis: No Has patient had a PCN reaction that required hospitalization: No Has patient had a PCN reaction  occurring within the last 10 years: yes If all of the above answers are "NO", then may proceed with Cephalosporin use.  Marland Kitchen Keflex [Cephalexin] Hives  . Penicillins Hives    Has patient had a PCN reaction causing immediate rash, facial/tongue/throat swelling, SOB or lightheadedness with hypotension: Yes Has patient had a PCN reaction causing severe rash involving mucus membranes or skin necrosis: No Has patient had a PCN reaction that required hospitalization: No Has patient had a PCN reaction occurring within the last 10 years: yes If all of the above answers are "NO", then may proceed with Cephalosporin use.  . Cephalosporins Rash    OBJECTIVE: BP 126/67 (BP Location: Left Arm, Patient Position: Sitting, Cuff Size: Normal)   Wt 126 lb (57.2 kg)   BMI 23.81 kg/m  Gen: No acute distress. Nontoxic in appearance.  HENT: AT. [place].  MMM.  Eyes:Pupils Equal Round Reactive to light, Extraocular movements intact,  Conjunctiva without redness, discharge or icterus. Skin: small patchy singulair vesicle rashes, no purpura or petechiae.  Neuro:  Alert. Oriented x3   ASSESSMENT AND PLAN: Kristina Rhodes is a 28 y.o. female present for  Plant dermatitis Discussed over-the-counter regimens for symptom control.  Called in prednisone taper for 10 days. Follow-up as needed > 15 minutes spent with patient, > 50% of that time face to face    Howard Pouch, DO 09/09/2019

## 2019-09-14 DIAGNOSIS — Z20828 Contact with and (suspected) exposure to other viral communicable diseases: Secondary | ICD-10-CM | POA: Diagnosis not present

## 2019-09-14 DIAGNOSIS — E210 Primary hyperparathyroidism: Secondary | ICD-10-CM | POA: Diagnosis not present

## 2019-09-14 DIAGNOSIS — Z79899 Other long term (current) drug therapy: Secondary | ICD-10-CM | POA: Diagnosis not present

## 2019-09-19 DIAGNOSIS — E210 Primary hyperparathyroidism: Secondary | ICD-10-CM | POA: Diagnosis not present

## 2019-09-22 DIAGNOSIS — E210 Primary hyperparathyroidism: Secondary | ICD-10-CM | POA: Diagnosis not present

## 2019-09-27 DIAGNOSIS — E210 Primary hyperparathyroidism: Secondary | ICD-10-CM | POA: Diagnosis not present

## 2019-09-27 DIAGNOSIS — E892 Postprocedural hypoparathyroidism: Secondary | ICD-10-CM | POA: Insufficient documentation

## 2019-09-27 DIAGNOSIS — Z9889 Other specified postprocedural states: Secondary | ICD-10-CM | POA: Insufficient documentation

## 2019-09-30 DIAGNOSIS — O139 Gestational [pregnancy-induced] hypertension without significant proteinuria, unspecified trimester: Secondary | ICD-10-CM

## 2019-09-30 HISTORY — DX: Gestational (pregnancy-induced) hypertension without significant proteinuria, unspecified trimester: O13.9

## 2019-09-30 NOTE — L&D Delivery Note (Signed)
Delivery Note At  a viable adult was delivered via ROA Presentation APGAR: 9 9  weight pending .   Placenta status:  Spontaneously with 3 vessel cord,  .  Cord:   with the following complications:none  .  Cord pH: not obtained  Anesthesia:  epidural Episiotomy:  none Lacerations:  none Suture Repair: not applicable Est. Blood Loss (mL):  50  Mom to postpartum.  Baby to Couplet care / Skin to Skin.  Cyril Mourning 09/06/2020, 7:34 AM

## 2019-10-21 ENCOUNTER — Other Ambulatory Visit: Payer: Self-pay | Admitting: Internal Medicine

## 2019-10-21 ENCOUNTER — Ambulatory Visit (INDEPENDENT_AMBULATORY_CARE_PROVIDER_SITE_OTHER): Payer: BC Managed Care – PPO | Admitting: Internal Medicine

## 2019-10-21 ENCOUNTER — Encounter: Payer: Self-pay | Admitting: Internal Medicine

## 2019-10-21 ENCOUNTER — Other Ambulatory Visit: Payer: Self-pay

## 2019-10-21 DIAGNOSIS — Q8789 Other specified congenital malformation syndromes, not elsewhere classified: Secondary | ICD-10-CM | POA: Diagnosis not present

## 2019-10-21 DIAGNOSIS — E210 Primary hyperparathyroidism: Secondary | ICD-10-CM

## 2019-10-21 DIAGNOSIS — E559 Vitamin D deficiency, unspecified: Secondary | ICD-10-CM

## 2019-10-21 DIAGNOSIS — D351 Benign neoplasm of parathyroid gland: Secondary | ICD-10-CM

## 2019-10-21 DIAGNOSIS — R7989 Other specified abnormal findings of blood chemistry: Secondary | ICD-10-CM

## 2019-10-21 DIAGNOSIS — D4989 Neoplasm of unspecified behavior of other specified sites: Secondary | ICD-10-CM

## 2019-10-21 HISTORY — DX: Primary hyperparathyroidism: E21.0

## 2019-10-21 NOTE — Progress Notes (Addendum)
Patient ID: Kristina Rhodes, female   DOB: [birth year], 29 y.o.   MRN: [id number]   Patient location: Home My location: Office Persons participating in the virtual visit: patient, provider  Referring Provider: Ma Hillock, DO  I connected with the patient on 10/21/19 at  9:20 AM EST by a video enabled telemedicine application and verified that I am speaking with the correct person.   I discussed the limitations of evaluation and management by telemedicine and the availability of in person appointments. The patient expressed understanding and agreed to proceed.   Details of the encounter are shown below. HPI  Kristina Rhodes is a 29 y.o.-year-old female, initially referred by her PCP, Kristina Rhodes, returning for follow-up for primary hyperparathyroidism PTH developed at the end of her pregnancy.  Last visit 6 months ago.  Since last visit, she saw Kristina Rhodes in [place] and she had right superior parathyroid adenoma resection in 09/14/2019.  A 2.25 g adenoma was removed.  Per review of records, her inferior parathyroid glands were not located during the surgery.  However, samples were taken from 5 months and from moderate and there was no evidence of ectopic parathyroid glands.  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  Intraoperative PTH level dropped 83% with an absolute value of 17.8 pg/ml, consistent with biochemical cure.  She developed transient hypocalcemia after surgery which was corrected with calcium intake.  However, she had an episode of hypercalcemia afterwards so her calcium supplements were reduced to once a day (1 Viactive + 1000 units daily).  Patient was discharged from [facility] and she will continue to follow with me.  She feels more energetic, less thirsty, less aches and pains. Still has some fatigue.  Reviewed and addended history: She was diagnosed with hypercalcemia in  02/2018.  She stopped breast-feeding in 01/2019.  I last saw her in 03/2019.  At that time, calcium was still high.  I reviewed patient's pertinent labs:  Presurgical PTH: 81.6  Lab Results  Component Value Date   PTH 34 04/21/2019   PTH 39 07/09/2018   PTH Comment 07/09/2018   PTH 45 05/28/2018   CALCIUM 11.4 (H) 04/21/2019   CALCIUM 11.4 (H) 07/09/2018   CALCIUM 11.6 (H) 05/28/2018   CALCIUM 11.3 (H) 05/04/2018   CALCIUM 9.9 03/19/2018   CALCIUM 10.8 (H) 03/18/2018   CALCIUM 10.3 02/13/2017  05/04/2015: Calcium 9.9 12/22/2013: Calcium 9.8  No fractures as an adult (2 wrist fx - as a child).  No history of kidney stones.  No CKD. Last BUN/Cr: Lab Results  Component Value Date   BUN 19 04/21/2019   BUN 17 05/04/2018   CREATININE 0.88 04/21/2019   CREATININE 0.92 05/04/2018   She has a history of vitamin D insufficiency.  Reviewed her vitamin D levels Lab Results  Component Value Date   VD25OH 27.75 (L) 04/21/2019   VD25OH 27.20 (L) 05/28/2018   She stopped prenatal vitamins in 06/2019.  We investigated her hypercalcemia further:   Calcitriol, PTH RP, magnesium, phosphorus, low normal ionized calcium was high: Component     Latest Ref Rng & Units 07/09/2018  Calcium     8.7 - 10.2 mg/dL 11.4 (H)  PTH, Intact     15 - 65 pg/mL 39  PTH Interp      Comment  Vitamin D 1, 25 (OH) Total     18 - 72 pg/mL 71  Vitamin  D3 1, 25 (OH)     pg/mL 71  Vitamin D2 1, 25 (OH)     pg/mL <8  Phosphorus     2.3 - 4.6 mg/dL 2.9  Magnesium     1.5 - 2.5 mg/dL 2.2  PTH-Related Protein (PTH-RP)     14 - 27 pg/mL 16   She had a normal 24-hour urine calcium: Component     Latest Ref Rng & Units 10/26/2018  Calcium, 24H Urine     mg/24 h 90  Creatinine, 24H Ur     0.50 - 2.15 g/24 h 1.13   She is not on HCTZ.  Pt does not have a FH of thyroid cancer, but does have + FH of OP.  She mentions her paternal cousin had a parathyroid tumor and kidney stones >> s/p  resection.  Pt. also has a history of GDM and HTN during and after pregnancy.  She had a large battery of genetic tests performed because of her Jewish ancestry and this was essentially negative for any pathologic mutation.  ROS: Constitutional: no weight gain/no weight loss, no fatigue, no subjective hyperthermia, no subjective hypothermia Eyes: no blurry vision, no xerophthalmia ENT: no sore throat, no nodules palpated in neck, no dysphagia, no odynophagia, no hoarseness Cardiovascular: no CP/no SOB/no palpitations/no leg swelling Respiratory: no cough/no SOB/no wheezing Gastrointestinal: no N/no V/no D/no C/no acid reflux Musculoskeletal: no muscle aches/no joint aches Skin: no rashes, no hair loss Neurological: no tremors/no numbness/no tingling/no dizziness  I reviewed pt's medications, allergies, PMH, social hx, family hx, and changes were documented in the history of present illness. Otherwise, unchanged from my initial visit note.  Past Medical History:  Diagnosis Date  . Allergy   . Heart murmur   . Hyperparathyroidism [facility])    Past Surgical History:  Procedure Laterality Date  . VAGINAL DELIVERY    . WISDOM TOOTH EXTRACTION     Social History   Socioeconomic History  . Marital status: Married    Spouse name: Not on file  . Number of children: 1  . Years of education: Not on file  . Highest education level: Not on file  Occupational History  . Not on file  Tobacco Use  . Smoking status: Never Smoker  . Smokeless tobacco: Never Used  Substance and Sexual Activity  . Alcohol use: Yes    Comment: occasional  . Drug use: No  . 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    Drinks caffeine. Herbal remedy use. Daily vitamin use.    Wears her seatbelt and bicycle helmet.    Exercises routinely.    Regular diet.    Smoke detector in the home.  No firearms in the home.    Feels safe in her relationships.    Current Outpatient Medications on File Prior to Visit  Medication Sig Dispense Refill  . predniSONE (DELTASONE) 20 MG tablet 60 mg x3d, 40 mg x3d, 20 mg x2d, 10 mg x2d 21 tablet 0   No current facility-administered medications on file prior to visit.   Allergies  Allergen Reactions  . Amoxicillin Hives    Has patient had a PCN reaction causing immediate rash, facial/tongue/throat swelling, SOB or lightheadedness with hypotension: Yes Has patient had a PCN reaction causing severe rash involving mucus membranes or skin necrosis: No Has patient had a PCN reaction that required hospitalization: No Has  patient had a PCN reaction occurring within the last 10 years: yes If all of the above answers are "NO", then may proceed with Cephalosporin use.  Marland Kitchen Keflex [Cephalexin] Hives  . Penicillins Hives    Has patient had a PCN reaction causing immediate rash, facial/tongue/throat swelling, SOB or lightheadedness with hypotension: Yes Has patient had a PCN reaction causing severe rash involving mucus membranes or skin necrosis: No Has patient had a PCN reaction that required hospitalization: No Has patient had a PCN reaction occurring within the last 10 years: yes If all of the above answers are "NO", then may proceed with Cephalosporin use.  . Cephalosporins Rash   Family History  Problem Relation Age of Onset  . Skin cancer Mother   . Asthma Mother   . Breast cancer Paternal Aunt 79  . Heart disease Paternal Grandmother   . Breast cancer Cousin 39  . Dementia Maternal Grandmother   . Asthma Maternal Grandmother   . Multiple sclerosis Cousin   . Miscarriages / Stillbirths Paternal Aunt   . Heart disease Paternal Grandfather     PE: There were no vitals taken for this visit. Wt Readings from Last 3 Encounters:  09/09/19 126 lb (57.2 kg)  05/06/19 125 lb 6 oz (56.9 kg)  04/21/19 125 lb (56.7 kg)   Constitutional:  in  NAD  The physical exam was not performed (virtual visit).  Assessment: 1.  Primary hyperparathyroidism  2.  Hyperparathyroidism-jaw tumor syndrome  3. Vitamin D insufficiency  Plan: Patient with history of primary hyperparathyroidism diagnosed after she was found to have a high calcium, at 11.6.  The corresponding intact PTH was not suppressed, at 45.  I initially thought that her hyperglycemia can be related to pregnancy, however, this persisted even after pregnancy.  She had no complications from the high calcium: No history of nephrolithiasis, osteoporosis, myalgias, abdominal pain, depression, bone pain.  -At last visit, investigation pointed towards primary hyperparathyroidism (magnesium, phosphorus, PTH RP, calcitriol) were normal and a vitamin D level was only slightly low, very close to the lower limit of normal.  We discussed about a referral to surgery.  She saw Kristina Rhodes -She had genetic testing which was positive for CDC 76 mutation which could be associated with multiple parathyroid adenomas.  She had ultrasound in his office and this was only revealing a right superior parathyroid adenoma.  She had parathyroidectomy on 09/14/2019.  Her PTH and calcium decreased postoperatively.  Of note, her inferior parathyroid glands were not visualized during the surgery.  Samples were taken from 9 months and they were not positive for parathyroid tissue.   - She is now on calcium and vitamin D supplements. -At this visit, we reviewed together her previous investigation performed by Kristina Rhodes, intraoperative note, pathology report, pre and postop lab results and postoperative course. -We will recheck her calcium, PTH, and vitamin D level now and I will be in touch with her after the results are back.  I am hoping we can stop Viactiv after the results are back. -Patient is planning a pregnancy and we discussed that there is no contraindication for this provided her labs are normal.  We have to be  careful with her calcium supplementation during her pregnancy. - I will see her back in 6 months  2.  Hyperparathyroid-jaw tumor syndrome -We discussed about the fact that this is associated with multiple parathyroid adenomas, however, not in all patients.  The fact that her PTH decrease significantly intraoperatively is in  an indication for complete treatment.  However, we will need to continue to follow her calcium and PTH closely -We discussed about the recommendation to have her dentist to image her jaw.  As of now, no jaw pain or masses that she can palpate. -I will get back to her if we need to do any other investigation  3.  Vitamin D insufficiency -Latest vitamin D level was slightly under the lower limit of normal -She is currently on Viactiv once a day and vitamin D 1000 units daily. -We will recheck level when she returns to the clinic  - time spent with the patient: 40 minutes, of which a significant amount of time was spent in reviewing information from her surgeon, also discussing about her symptoms, counseling her about her condition (please see the discussed topics above), and developing a plan to further investigate and treat her conditions; she had a number of questions which I addressed.  Orders Placed This Encounter  Procedures  . VITAMIN D 25 Hydroxy (Vit-D Deficiency, Fractures)  . PTH, intact and calcium   Component     Latest Ref Rng & Units 10/24/2019  Calcium     8.7 - 10.2 mg/dL 9.9  PTH, Intact     15 - 65 pg/mL 20  PTH Interp      Comment  TSH     0.35 - 4.50 uIU/mL 3.18  VITD     30.00 - 100.00 ng/mL 23.23 (L)  T4,Free(Direct)     0.60 - 1.60 ng/dL 0.68  Triiodothyronine,Free,Serum     2.3 - 4.2 pg/mL 3.2  Calcium and PTH levels are excellent. Vitamin D level is low.  We will need to increase the vitamin D to 3000 units daily and recheck the level in 2 months.  Philemon Kingdom, MD PhD [facility] Endocrinology

## 2019-10-21 NOTE — Patient Instructions (Signed)
Please return for labs at your convenience.    For now, continue 1 Viactiv tablets a day and 1000 units vitamin D daily.  Please return to see me in 6 months.

## 2019-10-24 ENCOUNTER — Other Ambulatory Visit (INDEPENDENT_AMBULATORY_CARE_PROVIDER_SITE_OTHER): Payer: BC Managed Care – PPO

## 2019-10-24 ENCOUNTER — Other Ambulatory Visit: Payer: Self-pay

## 2019-10-24 DIAGNOSIS — R7989 Other specified abnormal findings of blood chemistry: Secondary | ICD-10-CM | POA: Diagnosis not present

## 2019-10-24 DIAGNOSIS — E210 Primary hyperparathyroidism: Secondary | ICD-10-CM

## 2019-10-24 DIAGNOSIS — E559 Vitamin D deficiency, unspecified: Secondary | ICD-10-CM | POA: Diagnosis not present

## 2019-10-24 LAB — TSH: TSH: 3.18 u[IU]/mL (ref 0.35–4.50)

## 2019-10-24 LAB — VITAMIN D 25 HYDROXY (VIT D DEFICIENCY, FRACTURES): VITD: 23.23 ng/mL — ABNORMAL LOW (ref 30.00–100.00)

## 2019-10-24 LAB — T3, FREE: T3, Free: 3.2 pg/mL (ref 2.3–4.2)

## 2019-10-24 LAB — T4, FREE: Free T4: 0.68 ng/dL (ref 0.60–1.60)

## 2019-10-25 ENCOUNTER — Encounter: Payer: Self-pay | Admitting: Internal Medicine

## 2019-10-25 ENCOUNTER — Other Ambulatory Visit: Payer: Self-pay | Admitting: Internal Medicine

## 2019-10-25 LAB — PTH, INTACT AND CALCIUM
Calcium: 9.9 mg/dL (ref 8.7–10.2)
PTH: 20 pg/mL (ref 15–65)

## 2019-10-25 NOTE — Addendum Note (Signed)
Addended by: Philemon Kingdom on: 10/25/2019 05:19 PM   Modules accepted: Orders

## 2019-11-08 ENCOUNTER — Other Ambulatory Visit: Payer: Self-pay | Admitting: Internal Medicine

## 2019-11-08 ENCOUNTER — Encounter: Payer: Self-pay | Admitting: Internal Medicine

## 2019-11-08 DIAGNOSIS — D4989 Neoplasm of unspecified behavior of other specified sites: Secondary | ICD-10-CM

## 2019-11-08 DIAGNOSIS — E210 Primary hyperparathyroidism: Secondary | ICD-10-CM

## 2019-11-08 DIAGNOSIS — Q8789 Other specified congenital malformation syndromes, not elsewhere classified: Secondary | ICD-10-CM

## 2019-11-18 ENCOUNTER — Encounter: Payer: Self-pay | Admitting: Internal Medicine

## 2019-11-18 ENCOUNTER — Ambulatory Visit
Admission: RE | Admit: 2019-11-18 | Discharge: 2019-11-18 | Disposition: A | Discharge status: Home / Self Care | Payer: BC Managed Care – PPO | Source: Ambulatory Visit | Attending: Internal Medicine | Admitting: Internal Medicine

## 2019-11-18 DIAGNOSIS — E213 Hyperparathyroidism, unspecified: Secondary | ICD-10-CM | POA: Diagnosis not present

## 2019-11-18 DIAGNOSIS — D4989 Neoplasm of unspecified behavior of other specified sites: Secondary | ICD-10-CM

## 2019-11-27 ENCOUNTER — Ambulatory Visit: Payer: BC Managed Care – PPO | Attending: Internal Medicine

## 2019-11-27 DIAGNOSIS — Z23 Encounter for immunization: Secondary | ICD-10-CM | POA: Insufficient documentation

## 2019-11-27 NOTE — Progress Notes (Signed)
   Covid-19 Vaccination Clinic  Name:  Kristina Rhodes    MRN: [id number] DOB: 12/07/1990  11/27/2019  [name] was observed post Covid-19 immunization for 15 minutes without incidence. She was provided with Vaccine Information Sheet and instruction to access the V-Safe system.   [name] was instructed to call 911 with any severe reactions post vaccine: Marland Kitchen Difficulty breathing  . Swelling of your face and throat  . A fast heartbeat  . A bad rash all over your body  . Dizziness and weakness    Immunizations Administered    Name Date Dose VIS Date Route   Pfizer COVID-19 Vaccine 11/27/2019  9:16 AM 0.3 mL 09/09/2019 Intramuscular   Manufacturer: [place]   Lot: [id number]   [place]: [id number]

## 2019-12-17 ENCOUNTER — Ambulatory Visit: Payer: BC Managed Care – PPO | Attending: Internal Medicine

## 2019-12-17 DIAGNOSIS — Z23 Encounter for immunization: Secondary | ICD-10-CM

## 2019-12-17 NOTE — Progress Notes (Signed)
   Covid-19 Vaccination Clinic  Name:  Kristina Rhodes    MRN: [id number] DOB: 05/01/1991  12/17/2019  Kristina Rhodes was observed post Covid-19 immunization for 15 minutes without incident. She was provided with Vaccine Information Sheet and instruction to access the V-Safe system.   Kristina Rhodes was instructed to call 911 with any severe reactions post vaccine: Marland Kitchen Difficulty breathing  . Swelling of face and throat  . A fast heartbeat  . A bad rash all over body  . Dizziness and weakness   Immunizations Administered    Name Date Dose VIS Date Route   Pfizer COVID-19 Vaccine 12/17/2019 10:28 AM 0.3 mL 09/09/2019 Intramuscular   Manufacturer: [place]   Lot: [id number]   [place]: [id number]

## 2019-12-27 DIAGNOSIS — Z03818 Encounter for observation for suspected exposure to other biological agents ruled out: Secondary | ICD-10-CM | POA: Diagnosis not present

## 2020-01-30 DIAGNOSIS — N911 Secondary amenorrhea: Secondary | ICD-10-CM | POA: Diagnosis not present

## 2020-02-02 ENCOUNTER — Encounter: Payer: Self-pay | Admitting: Internal Medicine

## 2020-02-03 ENCOUNTER — Other Ambulatory Visit: Payer: Self-pay | Admitting: Internal Medicine

## 2020-02-03 DIAGNOSIS — R7989 Other specified abnormal findings of blood chemistry: Secondary | ICD-10-CM

## 2020-02-03 DIAGNOSIS — E559 Vitamin D deficiency, unspecified: Secondary | ICD-10-CM

## 2020-02-03 DIAGNOSIS — D4989 Neoplasm of unspecified behavior of other specified sites: Secondary | ICD-10-CM

## 2020-02-03 DIAGNOSIS — E210 Primary hyperparathyroidism: Secondary | ICD-10-CM

## 2020-02-09 ENCOUNTER — Other Ambulatory Visit (INDEPENDENT_AMBULATORY_CARE_PROVIDER_SITE_OTHER): Payer: BC Managed Care – PPO

## 2020-02-09 ENCOUNTER — Other Ambulatory Visit: Payer: Self-pay

## 2020-02-09 DIAGNOSIS — D4989 Neoplasm of unspecified behavior of other specified sites: Secondary | ICD-10-CM | POA: Diagnosis not present

## 2020-02-09 DIAGNOSIS — E210 Primary hyperparathyroidism: Secondary | ICD-10-CM

## 2020-02-09 DIAGNOSIS — Z3685 Encounter for antenatal screening for Streptococcus B: Secondary | ICD-10-CM | POA: Diagnosis not present

## 2020-02-09 DIAGNOSIS — R946 Abnormal results of thyroid function studies: Secondary | ICD-10-CM | POA: Diagnosis not present

## 2020-02-09 DIAGNOSIS — R7989 Other specified abnormal findings of blood chemistry: Secondary | ICD-10-CM

## 2020-02-09 DIAGNOSIS — Q8789 Other specified congenital malformation syndromes, not elsewhere classified: Secondary | ICD-10-CM | POA: Diagnosis not present

## 2020-02-09 DIAGNOSIS — E559 Vitamin D deficiency, unspecified: Secondary | ICD-10-CM | POA: Diagnosis not present

## 2020-02-09 DIAGNOSIS — Z3481 Encounter for supervision of other normal pregnancy, first trimester: Secondary | ICD-10-CM | POA: Diagnosis not present

## 2020-02-09 LAB — T4, FREE: Free T4: 0.85 ng/dL (ref 0.60–1.60)

## 2020-02-09 LAB — T3, FREE: T3, Free: 3.2 pg/mL (ref 2.3–4.2)

## 2020-02-09 LAB — TSH: TSH: 2.57 u[IU]/mL (ref 0.35–4.50)

## 2020-02-09 LAB — VITAMIN D 25 HYDROXY (VIT D DEFICIENCY, FRACTURES): VITD: 50.97 ng/mL (ref 30.00–100.00)

## 2020-02-10 LAB — PTH, INTACT AND CALCIUM
Calcium: 9.7 mg/dL (ref 8.7–10.2)
PTH: 19 pg/mL (ref 15–65)

## 2020-02-11 ENCOUNTER — Encounter: Payer: Self-pay | Admitting: Internal Medicine

## 2020-02-13 ENCOUNTER — Other Ambulatory Visit: Payer: Self-pay | Admitting: Internal Medicine

## 2020-02-13 DIAGNOSIS — E210 Primary hyperparathyroidism: Secondary | ICD-10-CM

## 2020-02-17 DIAGNOSIS — Z113 Encounter for screening for infections with a predominantly sexual mode of transmission: Secondary | ICD-10-CM | POA: Diagnosis not present

## 2020-02-17 DIAGNOSIS — Z331 Pregnant state, incidental: Secondary | ICD-10-CM | POA: Diagnosis not present

## 2020-02-17 DIAGNOSIS — Z3400 Encounter for supervision of normal first pregnancy, unspecified trimester: Secondary | ICD-10-CM | POA: Diagnosis not present

## 2020-02-17 DIAGNOSIS — Z124 Encounter for screening for malignant neoplasm of cervix: Secondary | ICD-10-CM | POA: Diagnosis not present

## 2020-02-21 LAB — HM PAP SMEAR: HM Pap smear: NOT DETECTED

## 2020-02-21 LAB — RESULTS CONSOLE HPV: CHL HPV: NEGATIVE

## 2020-03-05 DIAGNOSIS — Z3A11 11 weeks gestation of pregnancy: Secondary | ICD-10-CM | POA: Diagnosis not present

## 2020-03-05 DIAGNOSIS — Z3682 Encounter for antenatal screening for nuchal translucency: Secondary | ICD-10-CM | POA: Diagnosis not present

## 2020-03-05 DIAGNOSIS — Z3482 Encounter for supervision of other normal pregnancy, second trimester: Secondary | ICD-10-CM | POA: Diagnosis not present

## 2020-03-05 DIAGNOSIS — Z3A12 12 weeks gestation of pregnancy: Secondary | ICD-10-CM | POA: Diagnosis not present

## 2020-03-19 DIAGNOSIS — Z20822 Contact with and (suspected) exposure to covid-19: Secondary | ICD-10-CM | POA: Diagnosis not present

## 2020-03-27 DIAGNOSIS — Z1159 Encounter for screening for other viral diseases: Secondary | ICD-10-CM | POA: Diagnosis not present

## 2020-03-27 DIAGNOSIS — N760 Acute vaginitis: Secondary | ICD-10-CM | POA: Diagnosis not present

## 2020-03-27 DIAGNOSIS — Z3480 Encounter for supervision of other normal pregnancy, unspecified trimester: Secondary | ICD-10-CM | POA: Diagnosis not present

## 2020-04-18 DIAGNOSIS — Z3480 Encounter for supervision of other normal pregnancy, unspecified trimester: Secondary | ICD-10-CM | POA: Diagnosis not present

## 2020-04-18 DIAGNOSIS — Z3A18 18 weeks gestation of pregnancy: Secondary | ICD-10-CM | POA: Diagnosis not present

## 2020-04-18 DIAGNOSIS — Z361 Encounter for antenatal screening for raised alphafetoprotein level: Secondary | ICD-10-CM | POA: Diagnosis not present

## 2020-04-18 DIAGNOSIS — Z363 Encounter for antenatal screening for malformations: Secondary | ICD-10-CM | POA: Diagnosis not present

## 2020-04-23 DIAGNOSIS — R7309 Other abnormal glucose: Secondary | ICD-10-CM | POA: Diagnosis not present

## 2020-05-09 ENCOUNTER — Encounter: Payer: BC Managed Care – PPO | Admitting: Family Medicine

## 2020-05-16 DIAGNOSIS — Z03818 Encounter for observation for suspected exposure to other biological agents ruled out: Secondary | ICD-10-CM | POA: Diagnosis not present

## 2020-05-17 ENCOUNTER — Encounter: Payer: Self-pay | Admitting: Family Medicine

## 2020-05-17 ENCOUNTER — Ambulatory Visit (INDEPENDENT_AMBULATORY_CARE_PROVIDER_SITE_OTHER): Payer: BC Managed Care – PPO | Admitting: Family Medicine

## 2020-05-17 ENCOUNTER — Other Ambulatory Visit: Payer: Self-pay

## 2020-05-17 VITALS — BP 116/65 | HR 72 | Temp 98.1°F | Resp 16 | Ht 60.24 in | Wt 142.2 lb

## 2020-05-17 DIAGNOSIS — Z0000 Encounter for general adult medical examination without abnormal findings: Secondary | ICD-10-CM

## 2020-05-17 DIAGNOSIS — E559 Vitamin D deficiency, unspecified: Secondary | ICD-10-CM

## 2020-05-17 DIAGNOSIS — Z13220 Encounter for screening for lipoid disorders: Secondary | ICD-10-CM

## 2020-05-17 DIAGNOSIS — Z9889 Other specified postprocedural states: Secondary | ICD-10-CM

## 2020-05-17 DIAGNOSIS — Z130 Encounter for screening for diseases of the blood and blood-forming organs and certain disorders involving the immune mechanism: Secondary | ICD-10-CM | POA: Diagnosis not present

## 2020-05-17 DIAGNOSIS — Z131 Encounter for screening for diabetes mellitus: Secondary | ICD-10-CM | POA: Diagnosis not present

## 2020-05-17 DIAGNOSIS — E210 Primary hyperparathyroidism: Secondary | ICD-10-CM | POA: Diagnosis not present

## 2020-05-17 DIAGNOSIS — E892 Postprocedural hypoparathyroidism: Secondary | ICD-10-CM

## 2020-05-17 DIAGNOSIS — Z3492 Encounter for supervision of normal pregnancy, unspecified, second trimester: Secondary | ICD-10-CM | POA: Insufficient documentation

## 2020-05-17 DIAGNOSIS — R7989 Other specified abnormal findings of blood chemistry: Secondary | ICD-10-CM

## 2020-05-17 LAB — COMPREHENSIVE METABOLIC PANEL
ALT: 9 U/L (ref 0–35)
AST: 13 U/L (ref 0–37)
Albumin: 4 g/dL (ref 3.5–5.2)
Alkaline Phosphatase: 40 U/L (ref 39–117)
BUN: 10 mg/dL (ref 6–23)
CO2: 23 mEq/L (ref 19–32)
Calcium: 9.7 mg/dL (ref 8.4–10.5)
Chloride: 103 mEq/L (ref 96–112)
Creatinine, Ser: 0.65 mg/dL (ref 0.40–1.20)
GFR: 106.15 mL/min (ref >60.00)
Glucose, Bld: 76 mg/dL (ref 70–99)
Potassium: 4.2 mEq/L (ref 3.5–5.1)
Sodium: 134 mEq/L — ABNORMAL LOW (ref 135–145)
Total Bilirubin: 0.3 mg/dL (ref 0.2–1.2)
Total Protein: 6.8 g/dL (ref 6.0–8.3)

## 2020-05-17 LAB — CBC WITH DIFFERENTIAL/PLATELET
Basophils Absolute: 0.1 10*3/uL (ref 0.0–0.1)
Basophils Relative: 0.8 % (ref 0.0–3.0)
Eosinophils Absolute: 0 10*3/uL (ref 0.0–0.7)
Eosinophils Relative: 0.3 % (ref 0.0–5.0)
HCT: 33 % — ABNORMAL LOW (ref 36.0–46.0)
Hemoglobin: 11.2 g/dL — ABNORMAL LOW (ref 12.0–15.0)
Lymphocytes Relative: 22.1 % (ref 12.0–46.0)
Lymphs Abs: 2.4 10*3/uL (ref 0.7–4.0)
MCHC: 34 g/dL (ref 30.0–36.0)
MCV: 92.7 fl (ref 78.0–100.0)
Monocytes Absolute: 0.6 10*3/uL (ref 0.1–1.0)
Monocytes Relative: 5.2 % (ref 3.0–12.0)
Neutro Abs: 7.8 10*3/uL — ABNORMAL HIGH (ref 1.4–7.7)
Neutrophils Relative %: 71.6 % (ref 43.0–77.0)
Platelets: 253 10*3/uL (ref 150.0–400.0)
RBC: 3.56 Mil/uL — ABNORMAL LOW (ref 3.87–5.11)
RDW: 13.5 % (ref 11.5–15.5)
WBC: 10.9 10*3/uL — ABNORMAL HIGH (ref 4.0–10.5)

## 2020-05-17 LAB — T3, FREE: T3, Free: 3.2 pg/mL (ref 2.3–4.2)

## 2020-05-17 LAB — LIPID PANEL
Cholesterol: 253 mg/dL — ABNORMAL HIGH (ref 0–200)
HDL: 90.7 mg/dL (ref >39.00)
LDL Cholesterol: 131 mg/dL — ABNORMAL HIGH (ref 0–99)
NonHDL: 162.69
Total CHOL/HDL Ratio: 3
Triglycerides: 156 mg/dL — ABNORMAL HIGH (ref 0.0–149.0)
VLDL: 31.2 mg/dL (ref 0.0–40.0)

## 2020-05-17 LAB — HEMOGLOBIN A1C: Hgb A1c MFr Bld: 4.7 % (ref 4.6–6.5)

## 2020-05-17 LAB — T4, FREE: Free T4: 0.64 ng/dL (ref 0.60–1.60)

## 2020-05-17 LAB — TSH: TSH: 3.45 u[IU]/mL (ref 0.35–4.50)

## 2020-05-17 LAB — VITAMIN D 25 HYDROXY (VIT D DEFICIENCY, FRACTURES): VITD: 45.05 ng/mL (ref 30.00–100.00)

## 2020-05-17 NOTE — Progress Notes (Signed)
This visit occurred during the SARS-CoV-2 public health emergency.  Safety protocols were in place, including screening questions prior to the visit, additional usage of staff PPE, and extensive cleaning of exam room while observing appropriate contact time as indicated for disinfecting solutions.    Patient ID: Kristina Rhodes, female  DOB: 02/16/1991, 29 y.o.   MRN: [id number] Patient Care Team    Relationship Specialty Notifications Start End  Ma Hillock, DO PCP - General Family Medicine  09/14/19   Linda Hedges, DO Consulting Physician Obstetrics and Gynecology  02/13/17   Ma Hillock, DO Consulting Physician Family Medicine  09/09/19     Chief Complaint  Patient presents with  . Annual Exam    physical    Subjective:  Kristina Rhodes is a 29 y.o.  Female  present for CPE . All past medical history, surgical history, allergies, family history, immunizations, medications and social history were updated in the electronic medical record today. All recent labs, ED visits and hospitalizations within the last year were reviewed.  Health maintenance:  Colonoscopy: No Fhx, Screen at 43 Mammogram: Baker present. Screen at 38, baseline prior.  Cervical cancer screening: has GYN, Dr. Lynnette Caffey UTD 01/2020 Immunizations: UTD tdap2018, Influenza yearly encouraged (ok to give by nurse visit end of September). Covid series completed Infectious disease screening: HIV completed. DEXA: Screen at 58 Assistive device: none Oxygen BPZ:WCHE Patient has a Dental home. Hospitalizations/ED visits: reviewed   Depression screen [facility] 2/9 05/06/2019 05/04/2018 01/06/2018 02/13/2017 02/27/2016  Decreased Interest 0 0 0 0 0  Down, Depressed, Hopeless 0 0 0 0 0  PHQ - 2 Score 0 0 0 0 0   No flowsheet data found.  Immunization History  Administered Date(s) Administered  . DTaP 04/20/1989, 07/08/1989, 09/03/1989, 08/30/1990, 06/24/1994  . HPV Quadrivalent 09/16/2004, 11/21/2005, 03/18/2006  . Hepatitis A  09/16/2005, 03/18/2006  . Hepatitis A, Ped/Adol-2 Dose 09/16/2005, 03/18/2006  . Hepatitis B 06/24/1994, 08/06/1994, 01/23/1995  . Hepatitis B, ped/adol 06/24/1994, 08/06/1994, 01/23/1995  . IPV 04/20/1989, 07/08/1989, 08/30/1990, 06/24/1994  . Influenza Split 07/08/1989, 06/29/2013  . Influenza, Seasonal, Injecte, Preservative Fre 06/29/2013  . Influenza,inj,Quad PF,6+ Mos 07/18/2018, 07/15/2019  . MMR 05/26/1990, 06/05/1994, 05/04/2018  . Meningococcal Conjugate 03/18/2006  . PFIZER SARS-COV-2 Vaccination 11/27/2019, 12/17/2019  . Td 12/13/2002  . Tdap 03/22/2007, 02/13/2017  . Varicella 08/06/1994, 03/08/2007     Past Medical History:  Diagnosis Date  . Allergy   . Heart murmur   . Hyperparathyroidism ([place])    Allergies  Allergen Reactions  . Amoxicillin Hives    Has patient had a PCN reaction causing immediate rash, facial/tongue/throat swelling, SOB or lightheadedness with hypotension: Yes Has patient had a PCN reaction causing severe rash involving mucus membranes or skin necrosis: No Has patient had a PCN reaction that required hospitalization: No Has patient had a PCN reaction occurring within the last 10 years: yes If all of the above answers are "NO", then may proceed with Cephalosporin use.  Marland Kitchen Keflex [Cephalexin] Hives  . Penicillins Hives    Has patient had a PCN reaction causing immediate rash, facial/tongue/throat swelling, SOB or lightheadedness with hypotension: Yes Has patient had a PCN reaction causing severe rash involving mucus membranes or skin necrosis: No Has patient had a PCN reaction that required hospitalization: No Has patient had a PCN reaction occurring within the last 10 years: yes If all of the above answers are "NO", then may proceed with Cephalosporin use.  . Cephalosporins Rash   Past Surgical  History:  Procedure Laterality Date  . VAGINAL DELIVERY    . WISDOM TOOTH EXTRACTION     Family History  Problem Relation Age of Onset  . Skin  cancer Mother   . Asthma Mother   . Breast cancer Paternal Aunt 57  . Heart disease Paternal Grandmother   . Breast cancer Cousin 40  . Dementia Maternal Grandmother   . Asthma Maternal Grandmother   . Multiple sclerosis Cousin   . Miscarriages / Stillbirths Paternal Aunt   . Heart disease Paternal Grandfather    Social History   Social History Narrative   Married to Curtis   Master's in education. Learning specialist.    Drinks caffeine. Herbal remedy use. Daily vitamin use.    Wears her seatbelt and bicycle helmet.    Exercises routinely.    Regular diet.    Smoke detector in the home. No firearms in the home.    Feels safe in her relationships.     Allergies as of 05/17/2020      Reactions   Amoxicillin Hives   Has patient had a PCN reaction causing immediate rash, facial/tongue/throat swelling, SOB or lightheadedness with hypotension: Yes Has patient had a PCN reaction causing severe rash involving mucus membranes or skin necrosis: No Has patient had a PCN reaction that required hospitalization: No Has patient had a PCN reaction occurring within the last 10 years: yes If all of the above answers are "NO", then may proceed with Cephalosporin use.   Keflex [cephalexin] Hives   Penicillins Hives   Has patient had a PCN reaction causing immediate rash, facial/tongue/throat swelling, SOB or lightheadedness with hypotension: Yes Has patient had a PCN reaction causing severe rash involving mucus membranes or skin necrosis: No Has patient had a PCN reaction that required hospitalization: No Has patient had a PCN reaction occurring within the last 10 years: yes If all of the above answers are "NO", then may proceed with Cephalosporin use.   Cephalosporins Rash      Medication List       Accurate as of May 17, 2020 11:42 AM. If you have any questions, ask your nurse or doctor.        aspirin 81 MG chewable tablet Chew 81 mg by mouth daily.   cholecalciferol 25 MCG  (1000 UNIT) tablet Commonly known as: VITAMIN D3 Take 3,000 Units by mouth daily.   magnesium 30 MG tablet Take 30 mg by mouth once.   prenatal multivitamin Tabs tablet Take 1 tablet by mouth daily at 12 noon.       All past medical history, surgical history, allergies, family history, immunizations andmedications were updated in the EMR today and reviewed under the history and medication portions of their EMR.     No results found for this or any previous visit (from the past 2160 hour(s)).   ROS: 14 pt review of systems performed and negative (unless mentioned in an HPI)  Objective: BP 116/65 (BP Location: Right Arm, Patient Position: Sitting, Cuff Size: Normal)   Pulse 72   Temp 98.1 F (36.7 C) (Oral)   Resp 16   Ht 5' 0.24" (1.53 m)   Wt 142 lb 3.2 oz (64.5 kg)   SpO2 100%   BMI 27.55 kg/m   G2P1001 @ [redacted]w[redacted]d Gen: Afebrile. No acute distress. Nontoxic in appearance, well-developed, well-nourished, gravid female.  Very pleasant. HENT: AT. [place]. Bilateral TM visualized and normal in appearance, normal external auditory canal. MMM, no oral lesions, adequate dentition. Bilateral   nares within normal limits. Throat without erythema, ulcerations or exudates.  No cough on exam, no hoarseness on exam. Eyes:Pupils Equal Round Reactive to light, Extraocular movements intact,  Conjunctiva without redness, discharge or icterus. Neck/lymp/endocrine: Supple, no lymphadenopathy, no thyromegaly CV: RRR no murmur, no edema, +2/4 P posterior tibialis pulses. No JVD. Chest: CTAB, no wheeze, rhonchi or crackles.  Normal respiratory effort.  Good air movement. Abd: Soft.  Gravid. NTND. BS present.  No masses palpated. No hepatosplenomegaly. No rebound tenderness or guarding. Skin: No rashes, purpura or petechiae. Warm and well-perfused. Skin intact. Neuro/Msk:  Normal gait. PERLA. EOMi. Alert. Oriented x3.  Cranial nerves II through XII intact. Muscle strength 5/5 upper/lower extremity. DTRs equal  bilaterally. Psych: Normal affect, dress and demeanor. Normal speech. Normal thought content and judgment.  No exam data present  Assessment/plan: Rhyli Depaula is a 29 y.o. female present for CPE Abnormal TSH We will send results to endocrinologist - TSH - T3, free - T4, free  Primary hyperparathyroidism (HCC)/Vitamin D insufficiency/Status post parathyroidectomy/Hypercalcemia Patient due for endocrine labs.  We will collect today with her physical labs and send to endocrinologist per patient request - Comprehensive metabolic panel - PTH, Intact and Calcium - Vitamin D (25 hydroxy)  Screening for deficiency anemia Second trimester pregnancy - CBC with Differential/Platelet  Diabetes mellitus screening - Hemoglobin A1c  Lipid screening - Lipid panel-needed for employee/insurance form   Second trimester pregnancy Pregnancy going well.G2P1001 [redacted]w[redacted]d Has had adequate prenatal care.  Established with obstetrics on prenatal vitamins  Encounter for preventative adult health care examination Patient was encouraged to exercise greater than 150 minutes a week. Patient was encouraged to choose a diet filled with fresh fruits and vegetables, and lean meats. AVS provided to patient today for education/recommendation on gender specific health and safety maintenance. Colonoscopy: No Fhx, Screen at 43 Mammogram: FHubbellpresent. Screen at 40, baseline prior.  Cervical cancer screening: has GYN, Dr. MLynnette CaffeyUTD 01/2020 Immunizations: UTD tdap2018, Influenza yearly encouraged (ok to give by nurse visit end of September). Covid series completed Infectious disease screening: HIV completed.   Return in about 1 year (around 05/17/2021) for CPE (30 min).    Orders Placed This Encounter  Procedures  . CBC with Differential/Platelet  . Comprehensive metabolic panel  . Lipid panel  . TSH  . Hemoglobin A1c  . PTH, Intact and Calcium  . Vitamin D (25 hydroxy)  . T3, free  . T4, free   No  orders of the defined types were placed in this encounter.  Referral Orders  No referral(s) requested today     Electronically signed by: RHoward Pouch DNelson

## 2020-05-17 NOTE — Patient Instructions (Signed)
Health Maintenance, Female Adopting a healthy lifestyle and getting preventive care are important in promoting health and wellness. Ask your health care provider about:  The right schedule for you to have regular tests and exams.  Things you can do on your own to prevent diseases and keep yourself healthy. What should I know about diet, weight, and exercise? Eat a healthy diet   Eat a diet that includes plenty of vegetables, fruits, low-fat dairy products, and lean protein.  Do not eat a lot of foods that are high in solid fats, added sugars, or sodium. Maintain a healthy weight Body mass index (BMI) is used to identify weight problems. It estimates body fat based on height and weight. Your health care provider can help determine your BMI and help you achieve or maintain a healthy weight. Get regular exercise Get regular exercise. This is one of the most important things you can do for your health. Most adults should:  Exercise for at least 150 minutes each week. The exercise should increase your heart rate and make you sweat (moderate-intensity exercise).  Do strengthening exercises at least twice a week. This is in addition to the moderate-intensity exercise.  Spend less time sitting. Even light physical activity can be beneficial. Watch cholesterol and blood lipids Have your blood tested for lipids and cholesterol at 31 years of age, then have this test every 5 years. Have your cholesterol levels checked more often if:  Your lipid or cholesterol levels are high.  You are older than 31 years of age.  You are at high risk for heart disease. What should I know about cancer screening? Depending on your health history and family history, you may need to have cancer screening at various ages. This may include screening for:  Breast cancer.  Cervical cancer.  Colorectal cancer.  Skin cancer.  Lung cancer. What should I know about heart disease, diabetes, and high blood  pressure? Blood pressure and heart disease  High blood pressure causes heart disease and increases the risk of stroke. This is more likely to develop in people who have high blood pressure readings, are of African descent, or are overweight.  Have your blood pressure checked: ? Every 3-5 years if you are 18-39 years of age. ? Every year if you are 40 years old or older. Diabetes Have regular diabetes screenings. This checks your fasting blood sugar level. Have the screening done:  Once every three years after age 40 if you are at a normal weight and have a low risk for diabetes.  More often and at a younger age if you are overweight or have a high risk for diabetes. What should I know about preventing infection? Hepatitis B If you have a higher risk for hepatitis B, you should be screened for this virus. Talk with your health care provider to find out if you are at risk for hepatitis B infection. Hepatitis C Testing is recommended for:  Everyone born from 1945 through 1965.  Anyone with known risk factors for hepatitis C. Sexually transmitted infections (STIs)  Get screened for STIs, including gonorrhea and chlamydia, if: ? You are sexually active and are younger than 31 years of age. ? You are older than 31 years of age and your health care provider tells you that you are at risk for this type of infection. ? Your sexual activity has changed since you were last screened, and you are at increased risk for chlamydia or gonorrhea. Ask your health care provider if   you are at risk.  Ask your health care provider about whether you are at high risk for HIV. Your health care provider may recommend a prescription medicine to help prevent HIV infection. If you choose to take medicine to prevent HIV, you should first get tested for HIV. You should then be tested every 3 months for as long as you are taking the medicine. Pregnancy  If you are about to stop having your period (premenopausal) and  you may become pregnant, seek counseling before you get pregnant.  Take 400 to 800 micrograms (mcg) of folic acid every day if you become pregnant.  Ask for birth control (contraception) if you want to prevent pregnancy. Osteoporosis and menopause Osteoporosis is a disease in which the bones lose minerals and strength with aging. This can result in bone fractures. If you are 65 years old or older, or if you are at risk for osteoporosis and fractures, ask your health care provider if you should:  Be screened for bone loss.  Take a calcium or vitamin D supplement to lower your risk of fractures.  Be given hormone replacement therapy (HRT) to treat symptoms of menopause. Follow these instructions at home: Lifestyle  Do not use any products that contain nicotine or tobacco, such as cigarettes, e-cigarettes, and chewing tobacco. If you need help quitting, ask your health care provider.  Do not use street drugs.  Do not share needles.  Ask your health care provider for help if you need support or information about quitting drugs. Alcohol use  Do not drink alcohol if: ? Your health care provider tells you not to drink. ? You are pregnant, may be pregnant, or are planning to become pregnant.  If you drink alcohol: ? Limit how much you use to 0-1 drink a day. ? Limit intake if you are breastfeeding.  Be aware of how much alcohol is in your drink. In the [place] one drink equals one 12 oz bottle of beer (355 mL), one 5 oz glass of wine (148 mL), or one 1 oz glass of hard liquor (44 mL). General instructions  Schedule regular health, dental, and eye exams.  Stay current with your vaccines.  Tell your health care provider if: ? You often feel depressed. ? You have ever been abused or do not feel safe at home. Summary  Adopting a healthy lifestyle and getting preventive care are important in promoting health and wellness.  Follow your health care provider's instructions about healthy  diet, exercising, and getting tested or screened for diseases.  Follow your health care provider's instructions on monitoring your cholesterol and blood pressure. This information is not intended to replace advice given to you by your health care provider. Make sure you discuss any questions you have with your health care provider. Document Revised: 09/08/2018 Document Reviewed: 09/08/2018 Elsevier Patient Education  2020 [employer] Inc.  

## 2020-05-21 ENCOUNTER — Telehealth: Payer: Self-pay | Admitting: Family Medicine

## 2020-05-21 LAB — PTH, INTACT AND CALCIUM
Calcium: 9.5 mg/dL (ref 8.6–10.2)
PTH: 11 pg/mL — ABNORMAL LOW (ref 14–64)

## 2020-05-21 NOTE — Telephone Encounter (Signed)
Please call patient Liver, kidney and thyroid function are normal Blood cell counts and electrolytes are normal- as expected in pregnancy. Mildly lower than "normal" sodium. Could add a Gatorade daily (low sugar) . Cholesterol panel looks good.  PTH mildly lower than normal at 11 and calcium normal.  Vit d normal at 43 Please forward labs to her endocrinologist and OB.  Thanks.

## 2020-05-22 ENCOUNTER — Encounter: Payer: Self-pay | Admitting: Internal Medicine

## 2020-05-22 NOTE — Telephone Encounter (Signed)
Patient informed and verbalized understanding.  Will forward labs via Epic routing

## 2020-05-28 DIAGNOSIS — Z03818 Encounter for observation for suspected exposure to other biological agents ruled out: Secondary | ICD-10-CM | POA: Diagnosis not present

## 2020-06-05 DIAGNOSIS — O99810 Abnormal glucose complicating pregnancy: Secondary | ICD-10-CM | POA: Diagnosis not present

## 2020-06-05 LAB — OB RESULTS CONSOLE ANTIBODY SCREEN: Antibody Screen: NEGATIVE

## 2020-06-05 LAB — OB RESULTS CONSOLE ABO/RH: RH Type: POSITIVE

## 2020-06-05 LAB — OB RESULTS CONSOLE HEPATITIS B SURFACE ANTIGEN: Hepatitis B Surface Ag: NEGATIVE

## 2020-06-05 LAB — OB RESULTS CONSOLE GC/CHLAMYDIA
Chlamydia: NEGATIVE
Gonorrhea: NEGATIVE

## 2020-06-05 LAB — OB RESULTS CONSOLE RUBELLA ANTIBODY, IGM: Rubella: IMMUNE

## 2020-06-05 LAB — OB RESULTS CONSOLE RPR: RPR: NONREACTIVE

## 2020-06-05 LAB — OB RESULTS CONSOLE HIV ANTIBODY (ROUTINE TESTING): HIV: NONREACTIVE

## 2020-06-11 DIAGNOSIS — Z03818 Encounter for observation for suspected exposure to other biological agents ruled out: Secondary | ICD-10-CM | POA: Diagnosis not present

## 2020-06-19 DIAGNOSIS — Z3A27 27 weeks gestation of pregnancy: Secondary | ICD-10-CM | POA: Diagnosis not present

## 2020-06-19 DIAGNOSIS — Z363 Encounter for antenatal screening for malformations: Secondary | ICD-10-CM | POA: Diagnosis not present

## 2020-07-03 DIAGNOSIS — Z03818 Encounter for observation for suspected exposure to other biological agents ruled out: Secondary | ICD-10-CM | POA: Diagnosis not present

## 2020-07-10 DIAGNOSIS — Z23 Encounter for immunization: Secondary | ICD-10-CM | POA: Diagnosis not present

## 2020-07-10 DIAGNOSIS — D649 Anemia, unspecified: Secondary | ICD-10-CM | POA: Diagnosis not present

## 2020-07-25 DIAGNOSIS — Z03818 Encounter for observation for suspected exposure to other biological agents ruled out: Secondary | ICD-10-CM | POA: Diagnosis not present

## 2020-07-31 ENCOUNTER — Encounter: Payer: Self-pay | Admitting: Family Medicine

## 2020-07-31 DIAGNOSIS — Z03818 Encounter for observation for suspected exposure to other biological agents ruled out: Secondary | ICD-10-CM | POA: Diagnosis not present

## 2020-07-31 NOTE — Telephone Encounter (Signed)
Pt sent the  mychart message requesting our office to fill out physical exam letter. Attached form printed and placed on PCP desk for signature and completion. Please fax to [phone] or email to [email or web address] once completed.

## 2020-07-31 NOTE — Telephone Encounter (Signed)
Completed and returned to CMA work basket ?

## 2020-08-07 DIAGNOSIS — Z03818 Encounter for observation for suspected exposure to other biological agents ruled out: Secondary | ICD-10-CM | POA: Diagnosis not present

## 2020-08-21 ENCOUNTER — Other Ambulatory Visit: Payer: Self-pay

## 2020-08-21 ENCOUNTER — Inpatient Hospital Stay (HOSPITAL_COMMUNITY)
Admission: AD | Admit: 2020-08-21 | Discharge: 2020-08-21 | Disposition: A | Discharge status: Home / Self Care | Payer: BC Managed Care – PPO | Attending: Obstetrics and Gynecology | Admitting: Obstetrics and Gynecology

## 2020-08-21 ENCOUNTER — Encounter (HOSPITAL_COMMUNITY): Payer: Self-pay

## 2020-08-21 DIAGNOSIS — Z3A36 36 weeks gestation of pregnancy: Secondary | ICD-10-CM | POA: Diagnosis not present

## 2020-08-21 DIAGNOSIS — Z0371 Encounter for suspected problem with amniotic cavity and membrane ruled out: Secondary | ICD-10-CM | POA: Insufficient documentation

## 2020-08-21 DIAGNOSIS — R030 Elevated blood-pressure reading, without diagnosis of hypertension: Secondary | ICD-10-CM | POA: Diagnosis not present

## 2020-08-21 DIAGNOSIS — Z880 Allergy status to penicillin: Secondary | ICD-10-CM | POA: Diagnosis not present

## 2020-08-21 DIAGNOSIS — Z3685 Encounter for antenatal screening for Streptococcus B: Secondary | ICD-10-CM | POA: Diagnosis not present

## 2020-08-21 DIAGNOSIS — O26893 Other specified pregnancy related conditions, third trimester: Secondary | ICD-10-CM

## 2020-08-21 DIAGNOSIS — Z3689 Encounter for other specified antenatal screening: Secondary | ICD-10-CM

## 2020-08-21 DIAGNOSIS — O4693 Antepartum hemorrhage, unspecified, third trimester: Secondary | ICD-10-CM | POA: Insufficient documentation

## 2020-08-21 DIAGNOSIS — Z881 Allergy status to other antibiotic agents status: Secondary | ICD-10-CM | POA: Diagnosis not present

## 2020-08-21 LAB — URINALYSIS, ROUTINE W REFLEX MICROSCOPIC
Bilirubin Urine: NEGATIVE
Glucose, UA: NEGATIVE mg/dL
Ketones, ur: NEGATIVE mg/dL
Leukocytes,Ua: NEGATIVE
Nitrite: NEGATIVE
Protein, ur: NEGATIVE mg/dL
Specific Gravity, Urine: 1.009 (ref 1.005–1.030)
pH: 7 (ref 5.0–8.0)

## 2020-08-21 LAB — CBC
HCT: 32 % — ABNORMAL LOW (ref 36.0–46.0)
Hemoglobin: 11.1 g/dL — ABNORMAL LOW (ref 12.0–15.0)
MCH: 32.1 pg (ref 26.0–34.0)
MCHC: 34.7 g/dL (ref 30.0–36.0)
MCV: 92.5 fL (ref 80.0–100.0)
Platelets: 222 10*3/uL (ref 150–400)
RBC: 3.46 MIL/uL — ABNORMAL LOW (ref 3.87–5.11)
RDW: 12.6 % (ref 11.5–15.5)
WBC: 9.9 10*3/uL (ref 4.0–10.5)
nRBC: 0 % (ref 0.0–0.2)

## 2020-08-21 LAB — COMPREHENSIVE METABOLIC PANEL
ALT: 30 U/L (ref 0–44)
AST: 29 U/L (ref 15–41)
Albumin: 3.1 g/dL — ABNORMAL LOW (ref 3.5–5.0)
Alkaline Phosphatase: 102 U/L (ref 38–126)
Anion gap: 10 (ref 5–15)
BUN: 7 mg/dL (ref 6–20)
CO2: 21 mmol/L — ABNORMAL LOW (ref 22–32)
Calcium: 9.3 mg/dL (ref 8.9–10.3)
Chloride: 103 mmol/L (ref 98–111)
Creatinine, Ser: 0.83 mg/dL (ref 0.44–1.00)
GFR, Estimated: >60 mL/min (ref >60)
Glucose, Bld: 131 mg/dL — ABNORMAL HIGH (ref 70–99)
Potassium: 3.8 mmol/L (ref 3.5–5.1)
Sodium: 134 mmol/L — ABNORMAL LOW (ref 135–145)
Total Bilirubin: 0.3 mg/dL (ref 0.3–1.2)
Total Protein: 6.3 g/dL — ABNORMAL LOW (ref 6.5–8.1)

## 2020-08-21 LAB — OB RESULTS CONSOLE GBS: GBS: NEGATIVE

## 2020-08-21 LAB — PROTEIN / CREATININE RATIO, URINE
Creatinine, Urine: 73.84 mg/dL
Protein Creatinine Ratio: 0.14 mg/mg{Cre} (ref 0.00–0.15)
Total Protein, Urine: 10 mg/dL

## 2020-08-21 LAB — AMNISURE RUPTURE OF MEMBRANE (ROM) NOT AT ARMC: Amnisure ROM: NEGATIVE

## 2020-08-21 NOTE — Discharge Instructions (Signed)
Pelvic rest - nothing in the vagina while bleeding Call the office if bleeding starts again or come to MAU if heavy bleeding Return for any increased abdominal pain, rupture of membranes, decreased fetal movement. Keep your appointments in the office.

## 2020-08-21 NOTE — MAU Provider Note (Signed)
History     CSN: [id number]  Arrival date and time: 08/21/20 1859   First Provider Initiated Contact with Patient 08/21/20 1949      Chief Complaint  Patient presents with  . Abdominal Cramping  . Vaginal Bleeding   HPI Kristina Rhodes 29 y.o. [redacted]w[redacted]d Comes to MAU with vaginal bleeding.  Was checked in the office today and had a good amount of bleeding after her exam.  Continued to have bleeding and fluid at home - some question if her water had broken.  Office sent her to MAU for further evaluation for possible ROM and vaginal bleeding.  On arrival, states bleeding is much less.  Had one "clot" that was a pin head in size.  Having some contractions Braxton Hicks in quality.   OB History    Gravida  2   Para  1   Term  1   Preterm  0   AB  0   Living  1     SAB  0   TAB  0   Ectopic  0   Multiple  0   Live Births  1           Past Medical History:  Diagnosis Date  . Allergy   . Heart murmur   . Hyperparathyroidism [facility])     Past Surgical History:  Procedure Laterality Date  . VAGINAL DELIVERY    . WISDOM TOOTH EXTRACTION      Family History  Problem Relation Age of Onset  . Skin cancer Mother   . Asthma Mother   . Breast cancer Paternal Aunt 76  . Heart disease Paternal Grandmother   . Breast cancer Cousin 83  . Dementia Maternal Grandmother   . Asthma Maternal Grandmother   . Multiple sclerosis Cousin   . Miscarriages / Stillbirths Paternal Aunt   . Heart disease Paternal Grandfather     Social History   Tobacco Use  . Smoking status: Never Smoker  . Smokeless tobacco: Never Used  Vaping Use  . Vaping Use: Never used  Substance Use Topics  . Alcohol use: Not Currently    Comment: occasional  . Drug use: No    Allergies:  Allergies  Allergen Reactions  . Amoxicillin Hives    Has patient had a PCN reaction causing immediate rash, facial/tongue/throat swelling, SOB or lightheadedness with hypotension: Yes Has patient had a PCN  reaction causing severe rash involving mucus membranes or skin necrosis: No Has patient had a PCN reaction that required hospitalization: No Has patient had a PCN reaction occurring within the last 10 years: yes If all of the above answers are "NO", then may proceed with Cephalosporin use.  Marland Kitchen Keflex [Cephalexin] Hives  . Penicillins Hives    Has patient had a PCN reaction causing immediate rash, facial/tongue/throat swelling, SOB or lightheadedness with hypotension: Yes Has patient had a PCN reaction causing severe rash involving mucus membranes or skin necrosis: No Has patient had a PCN reaction that required hospitalization: No Has patient had a PCN reaction occurring within the last 10 years: yes If all of the above answers are "NO", then may proceed with Cephalosporin use.  . Cephalosporins Rash    No medications prior to admission.    Review of Systems  Constitutional: Negative for fever.  Respiratory: Negative for shortness of breath and stridor.   Gastrointestinal: Negative for abdominal pain.  Genitourinary: Positive for vaginal bleeding. Negative for dysuria and vaginal discharge.   Physical Exam  Blood pressure 125/74, pulse 72, temperature 98.3 F (36.8 C), temperature source Oral, resp. rate 15, SpO2 99 %.  Physical Exam Vitals and nursing note reviewed.  Constitutional:      Appearance: She is well-developed.  HENT:     Head: Normocephalic.  Abdominal:     Palpations: Abdomen is soft.     Tenderness: There is no abdominal tenderness. There is no guarding or rebound.  Musculoskeletal:        General: Normal range of motion.     Cervical back: Neck supple.  Skin:    General: Skin is warm and dry.  Neurological:     Mental Status: She is alert and oriented to person, place, and time.    Vitals:   08/21/20 2000 08/21/20 2016 08/21/20 2031 08/21/20 2112  BP: 123/72 124/77 127/76 125/74  Pulse: 82 76 75 72  Resp:    15  Temp:      TempSrc:      SpO2: 100%   99%      MAU Course  Procedures Results for orders placed or performed during the hospital encounter of 08/21/20 (from the past 24 hour(s))  Urinalysis, Routine w reflex microscopic Urine, Clean Catch     Status: Abnormal   Collection Time: 08/21/20  7:28 PM  Result Value Ref Range   Color, Urine YELLOW YELLOW   APPearance HAZY (A) CLEAR   Specific Gravity, Urine 1.009 1.005 - 1.030   pH 7.0 5.0 - 8.0   Glucose, UA NEGATIVE NEGATIVE mg/dL   Hgb urine dipstick MODERATE (A) NEGATIVE   Bilirubin Urine NEGATIVE NEGATIVE   Ketones, ur NEGATIVE NEGATIVE mg/dL   Protein, ur NEGATIVE NEGATIVE mg/dL   Nitrite NEGATIVE NEGATIVE   Leukocytes,Ua NEGATIVE NEGATIVE   RBC / HPF 6-10 0 - 5 RBC/hpf   WBC, UA 0-5 0 - 5 WBC/hpf   Bacteria, UA RARE (A) NONE SEEN   Squamous Epithelial / LPF 6-10 0 - 5   Mucus PRESENT   Amnisure rupture of membrane (rom)not at [facility]     Status: None   Collection Time: 08/21/20  7:43 PM  Result Value Ref Range   Amnisure ROM NEGATIVE   CBC     Status: Abnormal   Collection Time: 08/21/20  7:51 PM  Result Value Ref Range   WBC 9.9 4.0 - 10.5 K/uL   RBC 3.46 (L) 3.87 - 5.11 MIL/uL   Hemoglobin 11.1 (L) 12.0 - 15.0 g/dL   HCT 32.0 (L) 36 - 46 %   MCV 92.5 80.0 - 100.0 fL   MCH 32.1 26.0 - 34.0 pg   MCHC 34.7 30.0 - 36.0 g/dL   RDW 12.6 11.5 - 15.5 %   Platelets 222 150 - 400 K/uL   nRBC 0.0 0.0 - 0.2 %  Comprehensive metabolic panel     Status: Abnormal   Collection Time: 08/21/20  7:51 PM  Result Value Ref Range   Sodium 134 (L) 135 - 145 mmol/L   Potassium 3.8 3.5 - 5.1 mmol/L   Chloride 103 98 - 111 mmol/L   CO2 21 (L) 22 - 32 mmol/L   Glucose, Bld 131 (H) 70 - 99 mg/dL   BUN 7 6 - 20 mg/dL   Creatinine, Ser 0.83 0.44 - 1.00 mg/dL   Calcium 9.3 8.9 - 10.3 mg/dL   Total Protein 6.3 (L) 6.5 - 8.1 g/dL   Albumin 3.1 (L) 3.5 - 5.0 g/dL   AST 29 15 - 41 U/L  ALT 30 0 - 44 U/L   Alkaline Phosphatase 102 38 - 126 U/L   Total Bilirubin 0.3 0.3 - 1.2 mg/dL    GFR, Estimated >60 >60 mL/min   Anion gap 10 5 - 15  Protein / creatinine ratio, urine     Status: None   Collection Time: 08/21/20  7:51 PM  Result Value Ref Range   Creatinine, Urine 73.84 mg/dL   Total Protein, Urine 10 mg/dL   Protein Creatinine Ratio 0.14 0.00 - 0.15 mg/mg[Cre]    MDM Vaginal bleeding has decreased in quantity and is now light brown in color. Initial BPs were elevated.  Serial pressures and preeclampsia labs ordered.  Fetal monitor on to evaluate baby. Checked in the office today - which likely caused cervical bleeding. No evidence of ROM - amnisure is negative NST - FHT baseline 140 with moderate variability 15X15 accels noted No decelerations Contractions irregular with some uterine irritability Category 1 NST   Assessment and Plan  Vaginal bleeding after pelvic exam No evidence of ROM Elevated BP without diagnosis of HTN - labs normal Reactive NST  Plan Discharge home and return with ROM, increased vaginal bleeding, regular contractions or decreased fetal movement Keep your appointments in the office.   Nikiyah Fackler L Yordan Martindale 08/21/2020, 9:40 PM

## 2020-08-21 NOTE — MAU Note (Addendum)
...  Kristina Rhodes is a 29 y.o. at [redacted]w[redacted]d here in MAU reporting: Pt reports she had her cervix checked at the office today and when she sat up she felt a gush of fluids. She reports her provider checked the bleeding and stated that it was a little more than she would expect to see. She states the provider checked to see if it was amniotic fluid and it was negative. She reports that her provider instructed her to come get seen if her bleeding didn't stopped and if she began to show any other signs. She reports she called them back to let them know she was still bleeding and they instructed her to come here to MAU to see if it was her amniotic fluid. She reports she has not felt a continual leaking of fluids. +FM.  Pain: 1/10 cramping - Patient reports "it could be some gas pains honestly."

## 2020-08-29 DIAGNOSIS — Z20828 Contact with and (suspected) exposure to other viral communicable diseases: Secondary | ICD-10-CM | POA: Diagnosis not present

## 2020-09-03 ENCOUNTER — Other Ambulatory Visit: Payer: Self-pay

## 2020-09-03 ENCOUNTER — Inpatient Hospital Stay (EMERGENCY_DEPARTMENT_HOSPITAL)
Admission: AD | Admit: 2020-09-03 | Discharge: 2020-09-03 | Disposition: A | Discharge status: Home / Self Care | Payer: BC Managed Care – PPO | Source: Home / Self Care | Attending: Obstetrics and Gynecology | Admitting: Obstetrics and Gynecology

## 2020-09-03 ENCOUNTER — Encounter (HOSPITAL_COMMUNITY): Payer: Self-pay | Admitting: Obstetrics and Gynecology

## 2020-09-03 DIAGNOSIS — O26893 Other specified pregnancy related conditions, third trimester: Secondary | ICD-10-CM | POA: Diagnosis not present

## 2020-09-03 DIAGNOSIS — Z3689 Encounter for other specified antenatal screening: Secondary | ICD-10-CM

## 2020-09-03 DIAGNOSIS — R519 Headache, unspecified: Secondary | ICD-10-CM | POA: Diagnosis not present

## 2020-09-03 DIAGNOSIS — O163 Unspecified maternal hypertension, third trimester: Secondary | ICD-10-CM | POA: Diagnosis not present

## 2020-09-03 DIAGNOSIS — Z3A37 37 weeks gestation of pregnancy: Secondary | ICD-10-CM

## 2020-09-03 DIAGNOSIS — D649 Anemia, unspecified: Secondary | ICD-10-CM | POA: Diagnosis not present

## 2020-09-03 DIAGNOSIS — O133 Gestational [pregnancy-induced] hypertension without significant proteinuria, third trimester: Secondary | ICD-10-CM | POA: Insufficient documentation

## 2020-09-03 DIAGNOSIS — O134 Gestational [pregnancy-induced] hypertension without significant proteinuria, complicating childbirth: Secondary | ICD-10-CM | POA: Diagnosis not present

## 2020-09-03 DIAGNOSIS — Z880 Allergy status to penicillin: Secondary | ICD-10-CM | POA: Diagnosis not present

## 2020-09-03 DIAGNOSIS — Z20822 Contact with and (suspected) exposure to covid-19: Secondary | ICD-10-CM | POA: Diagnosis not present

## 2020-09-03 DIAGNOSIS — Z23 Encounter for immunization: Secondary | ICD-10-CM | POA: Diagnosis not present

## 2020-09-03 DIAGNOSIS — O9902 Anemia complicating childbirth: Secondary | ICD-10-CM | POA: Diagnosis not present

## 2020-09-03 DIAGNOSIS — O1493 Unspecified pre-eclampsia, third trimester: Secondary | ICD-10-CM | POA: Insufficient documentation

## 2020-09-03 DIAGNOSIS — Z3A38 38 weeks gestation of pregnancy: Secondary | ICD-10-CM | POA: Diagnosis not present

## 2020-09-03 HISTORY — DX: Anemia, unspecified: D64.9

## 2020-09-03 LAB — CBC
HCT: 33.5 % — ABNORMAL LOW (ref 36.0–46.0)
Hemoglobin: 12 g/dL (ref 12.0–15.0)
MCH: 32.1 pg (ref 26.0–34.0)
MCHC: 35.8 g/dL (ref 30.0–36.0)
MCV: 89.6 fL (ref 80.0–100.0)
Platelets: 221 10*3/uL (ref 150–400)
RBC: 3.74 MIL/uL — ABNORMAL LOW (ref 3.87–5.11)
RDW: 12.6 % (ref 11.5–15.5)
WBC: 10.3 10*3/uL (ref 4.0–10.5)
nRBC: 0 % (ref 0.0–0.2)

## 2020-09-03 LAB — COMPREHENSIVE METABOLIC PANEL
ALT: 40 U/L (ref 0–44)
AST: 34 U/L (ref 15–41)
Albumin: 3.1 g/dL — ABNORMAL LOW (ref 3.5–5.0)
Alkaline Phosphatase: 126 U/L (ref 38–126)
Anion gap: 11 (ref 5–15)
BUN: 10 mg/dL (ref 6–20)
CO2: 24 mmol/L (ref 22–32)
Calcium: 9.8 mg/dL (ref 8.9–10.3)
Chloride: 101 mmol/L (ref 98–111)
Creatinine, Ser: 0.89 mg/dL (ref 0.44–1.00)
GFR, Estimated: >60 mL/min (ref >60)
Glucose, Bld: 129 mg/dL — ABNORMAL HIGH (ref 70–99)
Potassium: 3.7 mmol/L (ref 3.5–5.1)
Sodium: 136 mmol/L (ref 135–145)
Total Bilirubin: 0.6 mg/dL (ref 0.3–1.2)
Total Protein: 7 g/dL (ref 6.5–8.1)

## 2020-09-03 LAB — PROTEIN / CREATININE RATIO, URINE
Creatinine, Urine: 167.84 mg/dL
Protein Creatinine Ratio: 0.14 mg/mg{Cre} (ref 0.00–0.15)
Total Protein, Urine: 24 mg/dL

## 2020-09-03 MED ORDER — ACETAMINOPHEN 500 MG PO TABS
1000.0000 mg | ORAL_TABLET | Freq: Once | ORAL | Status: AC
  Administered 2020-09-03: 1000 mg via ORAL
  Filled 2020-09-03: qty 2

## 2020-09-03 MED ORDER — ACETAMINOPHEN 500 MG PO TABS: 1000.0000 mg | ORAL_TABLET | Freq: Four times a day (QID) | ORAL | Status: DC | PRN

## 2020-09-03 NOTE — MAU Provider Note (Signed)
History     CSN: [id number]  Arrival date and time: 09/03/20 4967   First Provider Initiated Contact with Patient 09/03/20 1939      Chief Complaint  Patient presents with  . Headache   HPI This is a 29yo G2P1001 at [redacted]w[redacted]d presents with elevated BP and headache. Start at [place] to take a nap, but didn't improve - worsened. No visual changes. Had preeclampsia with first pregnancy.  Good fetal movement, no contractions, leaking fluid, bleeding.  OB History    Gravida  2   Para  1   Term  1   Preterm  0   AB  0   Living  1     SAB  0   TAB  0   Ectopic  0   Multiple  0   Live Births  1           Past Medical History:  Diagnosis Date  . Allergy   . Anemia   . Heart murmur   . Hyperparathyroidism ([place])   . Pregnancy induced hypertension     Past Surgical History:  Procedure Laterality Date  . PARATHYROIDECTOMY Left   . VAGINAL DELIVERY    . WISDOM TOOTH EXTRACTION      Family History  Problem Relation Age of Onset  . Skin cancer Mother   . Asthma Mother   . Breast cancer Paternal Aunt 84  . Heart disease Paternal Grandmother   . Breast cancer Cousin 85  . Dementia Maternal Grandmother   . Asthma Maternal Grandmother   . Multiple sclerosis Cousin   . Miscarriages / Stillbirths Paternal Aunt   . Heart disease Paternal Grandfather     Social History   Tobacco Use  . Smoking status: Never Smoker  . Smokeless tobacco: Never Used  Vaping Use  . Vaping Use: Never used  Substance Use Topics  . Alcohol use: Not Currently    Comment: occasional  . Drug use: No    Allergies:  Allergies  Allergen Reactions  . Amoxicillin Hives    Has patient had a PCN reaction causing immediate rash, facial/tongue/throat swelling, SOB or lightheadedness with hypotension: Yes Has patient had a PCN reaction causing severe rash involving mucus membranes or skin necrosis: No Has patient had a PCN reaction that required hospitalization: No Has patient had  a PCN reaction occurring within the last 10 years: yes If all of the above answers are "NO", then may proceed with Cephalosporin use.  Marland Kitchen Keflex [Cephalexin] Hives  . Penicillins Hives    Has patient had a PCN reaction causing immediate rash, facial/tongue/throat swelling, SOB or lightheadedness with hypotension: Yes Has patient had a PCN reaction causing severe rash involving mucus membranes or skin necrosis: No Has patient had a PCN reaction that required hospitalization: No Has patient had a PCN reaction occurring within the last 10 years: yes If all of the above answers are "NO", then may proceed with Cephalosporin use.  . Cephalosporins Rash    Medications Prior to Admission  Medication Sig Dispense Refill Last Dose  . aspirin 81 MG chewable tablet Chew 81 mg by mouth daily.   09/02/2020 at Unknown time  . cholecalciferol (VITAMIN D3) 25 MCG (1000 UNIT) tablet Take 3,000 Units by mouth daily.   09/03/2020 at Unknown time  . docusate sodium (COLACE) 250 MG capsule Take 250 mg by mouth daily.   09/02/2020 at Unknown time  . ferrous sulfate 324 MG TBEC Take 324 mg by mouth.  09/02/2020 at Unknown time  . magnesium 30 MG tablet Take 30 mg by mouth 2 (two) times daily.   09/02/2020 at Unknown time  . Prenatal Vit-Fe Fumarate-FA (PRENATAL MULTIVITAMIN) TABS tablet Take 1 tablet by mouth daily at 12 noon.   09/03/2020 at Unknown time    Review of Systems Physical Exam   Blood pressure (!) 149/84, pulse 78, temperature 98.7 F (37.1 C), temperature source Oral, resp. rate 20, height 5\' 1"  (1.549 m), weight 70.9 kg, SpO2 100 %.  Physical Exam Vitals and nursing note reviewed.  Constitutional:      Appearance: She is well-developed.  Pulmonary:     Effort: Pulmonary effort is normal.  Abdominal:     Palpations: Abdomen is soft.  Musculoskeletal:        General: Normal range of motion.  Skin:    General: Skin is warm and dry.  Neurological:     Mental Status: She is alert.     Comments:  2 beat clonus.  Psychiatric:        Mood and Affect: Mood normal.        Speech: Speech normal.        Behavior: Behavior normal.     MAU Course  Procedures NST reactive  MDM CMP, CBC, UP:C   Assessment and Plan  Care turned over to [facility], CNM.  Truett Mainland 09/03/2020, 7:39 PM

## 2020-09-03 NOTE — Discharge Instructions (Signed)

## 2020-09-03 NOTE — MAU Provider Note (Signed)
History     CSN: [id number]  Arrival date and time: 09/03/20 7902   First Provider Initiated Contact with Patient 09/03/20 1939      Chief Complaint  Patient presents with  . Headache   29 y.o. G2P1001 @37 .6 wks presenting with HA and elevated BP. She reports mild HA. Rates 1/10. See previous provider note for more details.    OB History    Gravida  2   Para  1   Term  1   Preterm  0   AB  0   Living  1     SAB  0   TAB  0   Ectopic  0   Multiple  0   Live Births  1           Past Medical History:  Diagnosis Date  . Allergy   . Anemia   . Heart murmur   . Hyperparathyroidism ([place])   . Pregnancy induced hypertension     Past Surgical History:  Procedure Laterality Date  . PARATHYROIDECTOMY Left   . VAGINAL DELIVERY    . WISDOM TOOTH EXTRACTION      Family History  Problem Relation Age of Onset  . Skin cancer Mother   . Asthma Mother   . Breast cancer Paternal Aunt 25  . Heart disease Paternal Grandmother   . Breast cancer Cousin 14  . Dementia Maternal Grandmother   . Asthma Maternal Grandmother   . Multiple sclerosis Cousin   . Miscarriages / Stillbirths Paternal Aunt   . Heart disease Paternal Grandfather     Social History   Tobacco Use  . Smoking status: Never Smoker  . Smokeless tobacco: Never Used  Vaping Use  . Vaping Use: Never used  Substance Use Topics  . Alcohol use: Not Currently    Comment: occasional  . Drug use: No    Allergies:  Allergies  Allergen Reactions  . Amoxicillin Hives    Has patient had a PCN reaction causing immediate rash, facial/tongue/throat swelling, SOB or lightheadedness with hypotension: Yes Has patient had a PCN reaction causing severe rash involving mucus membranes or skin necrosis: No Has patient had a PCN reaction that required hospitalization: No Has patient had a PCN reaction occurring within the last 10 years: yes If all of the above answers are "NO", then may proceed with  Cephalosporin use.  Marland Kitchen Keflex [Cephalexin] Hives  . Penicillins Hives    Has patient had a PCN reaction causing immediate rash, facial/tongue/throat swelling, SOB or lightheadedness with hypotension: Yes Has patient had a PCN reaction causing severe rash involving mucus membranes or skin necrosis: No Has patient had a PCN reaction that required hospitalization: No Has patient had a PCN reaction occurring within the last 10 years: yes If all of the above answers are "NO", then may proceed with Cephalosporin use.  . Cephalosporins Rash    Medications Prior to Admission  Medication Sig Dispense Refill Last Dose  . aspirin 81 MG chewable tablet Chew 81 mg by mouth daily.   09/02/2020 at Unknown time  . cholecalciferol (VITAMIN D3) 25 MCG (1000 UNIT) tablet Take 3,000 Units by mouth daily.   09/03/2020 at Unknown time  . docusate sodium (COLACE) 250 MG capsule Take 250 mg by mouth daily.   09/02/2020 at Unknown time  . ferrous sulfate 324 MG TBEC Take 324 mg by mouth.   09/02/2020 at Unknown time  . magnesium 30 MG tablet Take 30 mg by mouth 2 (two)  times daily.   09/02/2020 at Unknown time  . Prenatal Vit-Fe Fumarate-FA (PRENATAL MULTIVITAMIN) TABS tablet Take 1 tablet by mouth daily at 12 noon.   09/03/2020 at Unknown time    Review of Systems  Eyes: Negative for visual disturbance.  Gastrointestinal: Negative.   Neurological: Positive for headaches.   Physical Exam   Blood pressure 139/79, pulse 68, temperature 98.7 F (37.1 C), temperature source Oral, resp. rate 20, height 5\' 1"  (1.549 m), weight 70.9 kg, SpO2 99 %. Patient Vitals for the past 24 hrs:  BP Temp Temp src Pulse Resp SpO2 Height Weight  09/03/20 2101 139/79 -- -- 68 -- -- -- --  09/03/20 2045 129/80 -- -- 73 -- 99 % -- --  09/03/20 2031 139/81 -- -- 79 -- -- -- --  09/03/20 2016 (!) 146/75 -- -- 78 -- -- -- --  09/03/20 2001 (!) 147/83 -- -- 76 -- -- -- --  09/03/20 1955 -- -- -- -- -- 100 % -- --  09/03/20 1950 -- -- --  -- -- 100 % -- --  09/03/20 1946 (!) 152/87 -- -- 79 -- -- -- --  09/03/20 1945 -- -- -- -- -- 99 % -- --  09/03/20 1940 -- -- -- -- -- 100 % -- --  09/03/20 1935 -- -- -- -- -- 100 % -- --  09/03/20 1930 (!) 149/84 -- -- 78 -- 100 % -- --  09/03/20 1914 (!) 159/80 98.7 F (37.1 C) Oral 75 20 -- 5\' 1"  (1.549 m) 70.9 kg   Physical Exam Vitals and nursing note reviewed.  Constitutional:      General: She is not in acute distress.    Appearance: She is well-developed.  Cardiovascular:     Rate and Rhythm: Normal rate.  Pulmonary:     Effort: Pulmonary effort is normal. No respiratory distress.  Neurological:     Mental Status: She is alert.  Psychiatric:        Mood and Affect: Mood normal.        Behavior: Behavior normal.   EFM: 140 bpm, mod variability, + accels, no decels Toco: irreg  Results for orders placed or performed during the hospital encounter of 09/03/20 (from the past 24 hour(s))  Protein / creatinine ratio, urine     Status: None   Collection Time: 09/03/20  7:20 PM  Result Value Ref Range   Creatinine, Urine 167.84 mg/dL   Total Protein, Urine 24 mg/dL   Protein Creatinine Ratio 0.14 0.00 - 0.15 mg/mg[Cre]  CBC     Status: Abnormal   Collection Time: 09/03/20  7:27 PM  Result Value Ref Range   WBC 10.3 4.0 - 10.5 K/uL   RBC 3.74 (L) 3.87 - 5.11 MIL/uL   Hemoglobin 12.0 12.0 - 15.0 g/dL   HCT 33.5 (L) 36 - 46 %   MCV 89.6 80.0 - 100.0 fL   MCH 32.1 26.0 - 34.0 pg   MCHC 35.8 30.0 - 36.0 g/dL   RDW 12.6 11.5 - 15.5 %   Platelets 221 150 - 400 K/uL   nRBC 0.0 0.0 - 0.2 %  Comprehensive metabolic panel     Status: Abnormal   Collection Time: 09/03/20  7:27 PM  Result Value Ref Range   Sodium 136 135 - 145 mmol/L   Potassium 3.7 3.5 - 5.1 mmol/L   Chloride 101 98 - 111 mmol/L   CO2 24 22 - 32 mmol/L   Glucose, Bld 129 (H) 70 -  99 mg/dL   BUN 10 6 - 20 mg/dL   Creatinine, Ser 0.89 0.44 - 1.00 mg/dL   Calcium 9.8 8.9 - 10.3 mg/dL   Total Protein 7.0  6.5 - 8.1 g/dL   Albumin 3.1 (L) 3.5 - 5.0 g/dL   AST 34 15 - 41 U/L   ALT 40 0 - 44 U/L   Alkaline Phosphatase 126 38 - 126 U/L   Total Bilirubin 0.6 0.3 - 1.2 mg/dL   GFR, Estimated >60 >60 mL/min   Anion gap 11 5 - 15    MAU Course  Procedures Tylenol  MDM Labs ordered and reviewed. Doesn't meet criteria for gHTN. Consult with Dr. Gaetano Net, plan for BP check in office in 2 days. Stable for d/c home.   Assessment and Plan   1. [redacted] weeks gestation of pregnancy   2. NST (non-stress test) reactive   3. Transient hypertension of pregnancy in third trimester    Discharge home Follow up at [facility] on 12/8 PEC precautions  Allergies as of 09/03/2020      Reactions   Amoxicillin Hives   Has patient had a PCN reaction causing immediate rash, facial/tongue/throat swelling, SOB or lightheadedness with hypotension: Yes Has patient had a PCN reaction causing severe rash involving mucus membranes or skin necrosis: No Has patient had a PCN reaction that required hospitalization: No Has patient had a PCN reaction occurring within the last 10 years: yes If all of the above answers are "NO", then may proceed with Cephalosporin use.   Keflex [cephalexin] Hives   Penicillins Hives   Has patient had a PCN reaction causing immediate rash, facial/tongue/throat swelling, SOB or lightheadedness with hypotension: Yes Has patient had a PCN reaction causing severe rash involving mucus membranes or skin necrosis: No Has patient had a PCN reaction that required hospitalization: No Has patient had a PCN reaction occurring within the last 10 years: yes If all of the above answers are "NO", then may proceed with Cephalosporin use.   Cephalosporins Rash      Medication List    TAKE these medications   aspirin 81 MG chewable tablet Chew 81 mg by mouth daily.   cholecalciferol 25 MCG (1000 UNIT) tablet Commonly known as: VITAMIN D3 Take 3,000 Units by mouth daily.   docusate sodium 250  MG capsule Commonly known as: COLACE Take 250 mg by mouth daily.   ferrous sulfate 324 MG Tbec Take 324 mg by mouth.   magnesium 30 MG tablet Take 30 mg by mouth 2 (two) times daily.   prenatal multivitamin Tabs tablet Take 1 tablet by mouth daily at 12 noon.      Julianne Handler, CNM 09/03/2020, 9:59 PM

## 2020-09-03 NOTE — MAU Note (Addendum)
PT SAYS HAS HAD H/A- STARTED AT 1PM- NO MEDS FOR H/A- TOOK BP 157/84-90- CAN'T REMEMBER.      WAS HERE 2 WEEKS AGO - BP WAS UP. BUT AT APPOINTMENT WAS- OK.  [place]  WITH DR MORRIS . ALSO HAS HAD HEAD CONGESTION FROM A COLD .  CALLED OFFICE - TOLD TO COME IN .

## 2020-09-04 ENCOUNTER — Telehealth (HOSPITAL_COMMUNITY): Payer: Self-pay | Admitting: *Deleted

## 2020-09-04 NOTE — Telephone Encounter (Signed)
Preadmission screen  

## 2020-09-05 ENCOUNTER — Other Ambulatory Visit: Payer: Self-pay

## 2020-09-05 ENCOUNTER — Inpatient Hospital Stay (HOSPITAL_COMMUNITY): Payer: BC Managed Care – PPO | Admitting: Anesthesiology

## 2020-09-05 ENCOUNTER — Inpatient Hospital Stay (HOSPITAL_COMMUNITY)
Admission: AD | Admit: 2020-09-05 | Discharge: 2020-09-07 | DRG: 807 | Disposition: A | Discharge status: Home / Self Care | Payer: BC Managed Care – PPO | Attending: Obstetrics and Gynecology | Admitting: Obstetrics and Gynecology

## 2020-09-05 ENCOUNTER — Telehealth (HOSPITAL_COMMUNITY): Payer: Self-pay | Admitting: *Deleted

## 2020-09-05 ENCOUNTER — Encounter (HOSPITAL_COMMUNITY): Payer: Self-pay | Admitting: Obstetrics and Gynecology

## 2020-09-05 DIAGNOSIS — Z3A38 38 weeks gestation of pregnancy: Secondary | ICD-10-CM | POA: Diagnosis not present

## 2020-09-05 DIAGNOSIS — Z20822 Contact with and (suspected) exposure to covid-19: Secondary | ICD-10-CM | POA: Diagnosis present

## 2020-09-05 DIAGNOSIS — O139 Gestational [pregnancy-induced] hypertension without significant proteinuria, unspecified trimester: Secondary | ICD-10-CM | POA: Diagnosis present

## 2020-09-05 DIAGNOSIS — O134 Gestational [pregnancy-induced] hypertension without significant proteinuria, complicating childbirth: Secondary | ICD-10-CM | POA: Diagnosis present

## 2020-09-05 DIAGNOSIS — Z880 Allergy status to penicillin: Secondary | ICD-10-CM | POA: Diagnosis not present

## 2020-09-05 HISTORY — DX: Gestational (pregnancy-induced) hypertension without significant proteinuria, unspecified trimester: O13.9

## 2020-09-05 LAB — CBC
HCT: 32.2 % — ABNORMAL LOW (ref 36.0–46.0)
Hemoglobin: 11.7 g/dL — ABNORMAL LOW (ref 12.0–15.0)
MCH: 32.1 pg (ref 26.0–34.0)
MCHC: 36.3 g/dL — ABNORMAL HIGH (ref 30.0–36.0)
MCV: 88.2 fL (ref 80.0–100.0)
Platelets: 229 10*3/uL (ref 150–400)
RBC: 3.65 MIL/uL — ABNORMAL LOW (ref 3.87–5.11)
RDW: 12.6 % (ref 11.5–15.5)
WBC: 10.1 10*3/uL (ref 4.0–10.5)
nRBC: 0 % (ref 0.0–0.2)

## 2020-09-05 LAB — RESP PANEL BY RT-PCR (FLU A&B, COVID) ARPGX2
Influenza A by PCR: NEGATIVE
Influenza B by PCR: NEGATIVE
SARS Coronavirus 2 by RT PCR: NEGATIVE

## 2020-09-05 LAB — TYPE AND SCREEN
ABO/RH(D): B POS
Antibody Screen: NEGATIVE

## 2020-09-05 MED ORDER — TERBUTALINE SULFATE 1 MG/ML IJ SOLN: 0.2500 mg | Freq: Once | INTRAMUSCULAR | Status: DC | PRN

## 2020-09-05 MED ORDER — OXYTOCIN-SODIUM CHLORIDE 30-0.9 UT/500ML-% IV SOLN: 1.0000 m[IU]/min | INTRAVENOUS | Status: DC

## 2020-09-05 MED ORDER — DIPHENHYDRAMINE HCL 50 MG/ML IJ SOLN: 12.5000 mg | INTRAMUSCULAR | Status: DC | PRN

## 2020-09-05 MED ORDER — EPHEDRINE 5 MG/ML INJ: 10.0000 mg | INTRAVENOUS | Status: DC | PRN

## 2020-09-05 MED ORDER — FENTANYL-BUPIVACAINE-NACL 0.5-0.125-0.9 MG/250ML-% EP SOLN: 12.0000 mL/h | EPIDURAL | Status: DC | PRN

## 2020-09-05 MED ORDER — PHENYLEPHRINE 40 MCG/ML (10ML) SYRINGE FOR IV PUSH (FOR BLOOD PRESSURE SUPPORT): 80.0000 ug | PREFILLED_SYRINGE | INTRAVENOUS | Status: DC | PRN

## 2020-09-05 MED ORDER — FENTANYL-BUPIVACAINE-NACL 0.5-0.125-0.9 MG/250ML-% EP SOLN
EPIDURAL | Status: AC
  Filled 2020-09-05: qty 250

## 2020-09-05 MED ORDER — LACTATED RINGERS IV SOLN: INTRAVENOUS | Status: DC

## 2020-09-05 MED ORDER — LACTATED RINGERS IV SOLN: 500.0000 mL | INTRAVENOUS | Status: DC | PRN

## 2020-09-05 MED ORDER — OXYCODONE-ACETAMINOPHEN 5-325 MG PO TABS: 2.0000 | ORAL_TABLET | ORAL | Status: DC | PRN

## 2020-09-05 MED ORDER — ONDANSETRON HCL 4 MG/2ML IJ SOLN: 4.0000 mg | Freq: Four times a day (QID) | INTRAMUSCULAR | Status: DC | PRN

## 2020-09-05 MED ORDER — SODIUM CHLORIDE (PF) 0.9 % IJ SOLN
INTRAMUSCULAR | Status: DC | PRN
  Administered 2020-09-05: 12 mL/h via EPIDURAL

## 2020-09-05 MED ORDER — MISOPROSTOL 25 MCG QUARTER TABLET
25.0000 ug | ORAL_TABLET | ORAL | Status: DC | PRN
  Administered 2020-09-05 – 2020-09-06 (×3): 25 ug via VAGINAL
  Filled 2020-09-05 (×3): qty 1

## 2020-09-05 MED ORDER — OXYTOCIN BOLUS FROM INFUSION
333.0000 mL | Freq: Once | INTRAVENOUS | Status: AC
  Administered 2020-09-06: 333 mL via INTRAVENOUS

## 2020-09-05 MED ORDER — LACTATED RINGERS IV SOLN: 500.0000 mL | Freq: Once | INTRAVENOUS | Status: DC

## 2020-09-05 MED ORDER — LIDOCAINE HCL (PF) 1 % IJ SOLN: 30.0000 mL | INTRAMUSCULAR | Status: DC | PRN

## 2020-09-05 MED ORDER — OXYTOCIN-SODIUM CHLORIDE 30-0.9 UT/500ML-% IV SOLN
2.5000 [IU]/h | INTRAVENOUS | Status: DC
  Filled 2020-09-05: qty 500

## 2020-09-05 MED ORDER — LIDOCAINE HCL (PF) 1 % IJ SOLN
INTRAMUSCULAR | Status: DC | PRN
  Administered 2020-09-05: 10 mL via EPIDURAL

## 2020-09-05 MED ORDER — SOD CITRATE-CITRIC ACID 500-334 MG/5ML PO SOLN: 30.0000 mL | ORAL | Status: DC | PRN

## 2020-09-05 MED ORDER — ACETAMINOPHEN 325 MG PO TABS: 650.0000 mg | ORAL_TABLET | ORAL | Status: DC | PRN

## 2020-09-05 MED ORDER — OXYCODONE-ACETAMINOPHEN 5-325 MG PO TABS: 1.0000 | ORAL_TABLET | ORAL | Status: DC | PRN

## 2020-09-05 NOTE — Anesthesia Procedure Notes (Signed)
Epidural Patient location during procedure: OB Start time: 09/05/2020 11:45 PM End time: 09/05/2020 11:47 PM  Staffing Anesthesiologist: Lyn Hollingshead, MD Performed: anesthesiologist   Preanesthetic Checklist Completed: patient identified, IV checked, site marked, risks and benefits discussed, surgical consent, monitors and equipment checked, pre-op evaluation and timeout performed  Epidural Patient position: sitting Prep: DuraPrep and site prepped and draped Patient monitoring: continuous pulse ox and blood pressure Approach: midline Location: L3-L4 Injection technique: LOR air  Needle:  Needle type: Tuohy  Needle gauge: 17 G Needle length: 9 cm and 9 Needle insertion depth: 6 cm Catheter type: closed end flexible Catheter size: 19 Gauge Catheter at skin depth: 11 cm Test dose: negative and Other  Assessment Events: blood not aspirated, injection not painful, no injection resistance, no paresthesia and negative IV test  Additional Notes Reason for block:procedure for pain

## 2020-09-05 NOTE — H&P (Signed)
Kristina Rhodes is a 29 y.o. G 2 P 1001 at 71 w 1 days presents for IOL secondary to Gestational hypertension BPs 160/90s and 150/90s Sent over from office for 2 stage IOL Currently bed is on hold. OB History    Gravida  2   Para  1   Term  1   Preterm  0   AB  0   Living  1     SAB  0   TAB  0   Ectopic  0   Multiple  0   Live Births  1          Past Medical History:  Diagnosis Date  . Allergy   . Anemia   . Heart murmur   . Hyperparathyroidism ([place])   . Pregnancy induced hypertension    Past Surgical History:  Procedure Laterality Date  . PARATHYROIDECTOMY Left   . VAGINAL DELIVERY    . WISDOM TOOTH EXTRACTION     Family History: family history includes Asthma in her maternal grandmother and mother; Breast cancer (age of onset: 61) in her cousin; Breast cancer (age of onset: 31) in her paternal aunt; Dementia in her maternal grandmother; Heart disease in her paternal grandfather and paternal grandmother; 46 / [place] in her paternal aunt; Multiple sclerosis in her cousin; Skin cancer in her mother. Social History:  reports that she has never smoked. She has never used smokeless tobacco. She reports previous alcohol use. She reports that she does not use drugs.     Maternal Diabetes: No Genetic Screening: Normal Maternal Ultrasounds/Referrals: Normal Fetal Ultrasounds or other Referrals:  None Maternal Substance Abuse:  No Significant Maternal Medications:  None Significant Maternal Lab Results:  None Other Comments:  None  Review of Systems  All other systems reviewed and are negative.  History   Blood pressure (!) 157/82, pulse 70, temperature 98.3 F (36.8 C), resp. rate 19. Exam Physical Exam Vitals and nursing note reviewed. Exam conducted with a chaperone present.  Constitutional:      Appearance: Normal appearance.  HENT:     Right Ear: Tympanic membrane normal.  Eyes:     Pupils: Pupils are equal, round, and reactive to  light.  Cardiovascular:     Rate and Rhythm: Normal rate and regular rhythm.  Neurological:     Mental Status: She is alert.     Prenatal labs: ABO, Rh: B/Positive/-- (09/07 0000) Antibody: Negative (09/07 0000) Rubella: Immune (09/07 0000) RPR: Nonreactive (09/07 0000)  HBsAg: Negative (09/07 0000)  HIV: Non-reactive (09/07 0000)  GBS: Negative/-- (11/23 0000)   Assessment/Plan: IUP at term Gestational hypertension Admit to L and D  2 stage IOL   Cyril Mourning 09/05/2020, 2:18 PM

## 2020-09-05 NOTE — MAU Note (Signed)
Here for direct admit for gestational hypertension 160/90 in the office.  Denies HA/visual disturbances/epigastric pain.  Reports some occasional contractions for the past few weeks.  Denies any cervical exam since 36wks.  BSUS in the office confirms vertex presentation.

## 2020-09-05 NOTE — Telephone Encounter (Signed)
Preadmission screen  

## 2020-09-05 NOTE — Anesthesia Preprocedure Evaluation (Signed)
Anesthesia Evaluation  Patient identified by MRN, date of birth, ID band Patient awake    Reviewed: Allergy & Precautions, NPO status , Patient's Chart, lab work & pertinent test results  Airway Mallampati: I       Dental no notable dental hx.    Pulmonary neg pulmonary ROS,    Pulmonary exam normal        Cardiovascular Normal cardiovascular exam     Neuro/Psych negative neurological ROS  negative psych ROS   GI/Hepatic negative GI ROS, Neg liver ROS,   Endo/Other  negative endocrine ROS  Renal/GU negative Renal ROS  negative genitourinary   Musculoskeletal negative musculoskeletal ROS (+)   Abdominal Normal abdominal exam  (+)   Peds negative pediatric ROS (+)  Hematology  (+) anemia ,   Anesthesia Other Findings   Reproductive/Obstetrics negative OB ROS (+) Pregnancy                             Anesthesia Physical  Anesthesia Plan  ASA: II  Anesthesia Plan: Epidural   Post-op Pain Management:    Induction:   PONV Risk Score and Plan:   Airway Management Planned:   Additional Equipment:   Intra-op Plan:   Post-operative Plan:   Informed Consent: I have reviewed the patients History and Physical, chart, labs and discussed the procedure including the risks, benefits and alternatives for the proposed anesthesia with the patient or authorized representative who has indicated his/her understanding and acceptance.       Plan Discussed with:   Anesthesia Plan Comments:         Anesthesia Quick Evaluation

## 2020-09-06 ENCOUNTER — Encounter (HOSPITAL_COMMUNITY): Payer: Self-pay | Admitting: Obstetrics and Gynecology

## 2020-09-06 LAB — RPR: RPR Ser Ql: NONREACTIVE

## 2020-09-06 LAB — RUBELLA SCREEN: Rubella: 1.49 index (ref >0.99)

## 2020-09-06 MED ORDER — FLEET ENEMA 7-19 GM/118ML RE ENEM: 1.0000 | ENEMA | Freq: Every day | RECTAL | Status: DC | PRN

## 2020-09-06 MED ORDER — SENNOSIDES-DOCUSATE SODIUM 8.6-50 MG PO TABS
2.0000 | ORAL_TABLET | ORAL | Status: DC
  Administered 2020-09-06 – 2020-09-07 (×2): 2 via ORAL
  Filled 2020-09-06 (×2): qty 2

## 2020-09-06 MED ORDER — ZOLPIDEM TARTRATE 5 MG PO TABS: 5.0000 mg | ORAL_TABLET | Freq: Every evening | ORAL | Status: DC | PRN

## 2020-09-06 MED ORDER — MEDROXYPROGESTERONE ACETATE 150 MG/ML IM SUSP: 150.0000 mg | INTRAMUSCULAR | Status: DC | PRN

## 2020-09-06 MED ORDER — WITCH HAZEL-GLYCERIN EX PADS: 1.0000 "application " | MEDICATED_PAD | CUTANEOUS | Status: DC | PRN

## 2020-09-06 MED ORDER — MEASLES, MUMPS & RUBELLA VAC IJ SOLR: 0.5000 mL | Freq: Once | INTRAMUSCULAR | Status: DC

## 2020-09-06 MED ORDER — ACETAMINOPHEN 325 MG PO TABS
650.0000 mg | ORAL_TABLET | ORAL | Status: DC | PRN
  Administered 2020-09-06 – 2020-09-07 (×4): 650 mg via ORAL
  Filled 2020-09-06 (×4): qty 2

## 2020-09-06 MED ORDER — TETANUS-DIPHTH-ACELL PERTUSSIS 5-2.5-18.5 LF-MCG/0.5 IM SUSY: 0.5000 mL | PREFILLED_SYRINGE | Freq: Once | INTRAMUSCULAR | Status: DC

## 2020-09-06 MED ORDER — PRENATAL MULTIVITAMIN CH
1.0000 | ORAL_TABLET | Freq: Every day | ORAL | Status: DC
  Administered 2020-09-06 – 2020-09-07 (×2): 1 via ORAL
  Filled 2020-09-06 (×2): qty 1

## 2020-09-06 MED ORDER — BUPIVACAINE HCL (PF) 0.25 % IJ SOLN
INTRAMUSCULAR | Status: DC | PRN
  Administered 2020-09-06: 10 mL via EPIDURAL

## 2020-09-06 MED ORDER — DIBUCAINE (PERIANAL) 1 % EX OINT: 1.0000 "application " | TOPICAL_OINTMENT | CUTANEOUS | Status: DC | PRN

## 2020-09-06 MED ORDER — DIPHENHYDRAMINE HCL 25 MG PO CAPS: 25.0000 mg | ORAL_CAPSULE | Freq: Four times a day (QID) | ORAL | Status: DC | PRN

## 2020-09-06 MED ORDER — COCONUT OIL OIL: 1.0000 "application " | TOPICAL_OIL | Status: DC | PRN

## 2020-09-06 MED ORDER — BISACODYL 10 MG RE SUPP: 10.0000 mg | Freq: Every day | RECTAL | Status: DC | PRN

## 2020-09-06 MED ORDER — SIMETHICONE 80 MG PO CHEW: 80.0000 mg | CHEWABLE_TABLET | ORAL | Status: DC | PRN

## 2020-09-06 MED ORDER — ONDANSETRON HCL 4 MG/2ML IJ SOLN: 4.0000 mg | INTRAMUSCULAR | Status: DC | PRN

## 2020-09-06 MED ORDER — IBUPROFEN 600 MG PO TABS
600.0000 mg | ORAL_TABLET | Freq: Four times a day (QID) | ORAL | Status: DC
  Administered 2020-09-06 – 2020-09-07 (×5): 600 mg via ORAL
  Filled 2020-09-06 (×4): qty 1

## 2020-09-06 MED ORDER — BENZOCAINE-MENTHOL 20-0.5 % EX AERO: 1.0000 "application " | INHALATION_SPRAY | CUTANEOUS | Status: DC | PRN

## 2020-09-06 MED ORDER — ONDANSETRON HCL 4 MG PO TABS: 4.0000 mg | ORAL_TABLET | ORAL | Status: DC | PRN

## 2020-09-06 NOTE — Lactation Note (Signed)
This note was copied from a baby's chart. Lactation Consultation Note  Patient Name: Kristina Rhodes [id number] Date: 09/06/2020 Reason for consult: Follow-up assessment  Initial visit to 96 hours old ETI of a P2 mother with 11 months of exclusive pumping and bottlefeeding experience. Mother states infant latched very well after delivery and on. Talked to mother about hand expression and demonstrated technique. Unable to express colostrum. Observed edematous nipples.  Discussed using shells to help with swelling and coconut oil to lubricate nipples. Mother verbalized agreement.   Plan: 1-Breastfeeding on demand, ensuring a deep, comfortable latch.  2-Offer breast 8-12 times in 24h period to establish good milk supply. 3-Undressing infant and place skin to skin when ready to breastfeed 4-Keep infant awake during breastfeeding session: massaging breast, infant's hand/shoulder/feet 5-Monitor voids and stools as signs good intake.  6-Encouraged maternal rest, hydration and food intake.  7-Contact LC as needed for feeds/support/concerns/questions   All questions answered at this time. Provided Lactation services brochure.   Maternal Data Formula Feeding for Exclusion: No Has patient been taught Hand Expression?: Yes Does the patient have breastfeeding experience prior to this delivery?: Yes  Feeding Feeding Type: Breast Fed  Interventions Interventions: Breast feeding basics reviewed;Skin to skin;Breast massage;Hand express;Shells;Coconut oil  Lactation Tools Discussed/Used Tools: Shells WIC Program: No   Consult Status Consult Status: Follow-up Date: 09/07/20 Follow-up type: In-patient    Jaala Bohle A Higuera [place] 09/06/2020, 10:58 PM

## 2020-09-06 NOTE — Transfer of Care (Signed)
Immediate Anesthesia Transfer of Care Note  Patient: Kristina Rhodes  Procedure(s) Performed: AN AD HOC LABOR EPIDURAL  Patient Location: PACU  Anesthesia Type:Epidural  Level of Consciousness: awake  Airway & Oxygen Therapy: Patient Spontanous Breathing  Post-op Assessment: Report given to RN  Post vital signs: Reviewed and stable  Last Vitals:  Vitals Value Taken Time  BP    Temp    Pulse    Resp    SpO2      Last Pain:  Vitals:   09/06/20 1246  TempSrc:   PainSc: 2       Patients Stated Pain Goal: 8 (37/79/39 6886)  Complications: No complications documented.

## 2020-09-06 NOTE — Lactation Note (Signed)
This note was copied from a baby's chart. Lactation Consultation Note  Patient Name: Kristina Rhodes [id number] Date: 09/06/2020 Reason for consult: Initial assessment;Early term 37-38.6wks  P2 - per mom offered her 1st baby had a difficult time latching and she ended up pumping for 11-1/2 months and had great milk supply.  Baby is 1 hours old  As LC entered the room baby STS -noted to be latched shallow with defined cheek dimples. LC observed for 5 mins and then offered to assist to sandwich the areola to obtain depth. Baby was still sustaining latch with depth and 15 mins with swallows. Prior to the baby latching mom hand express on her own with drops.  Mom aware when she is transferred to the [facility] unit LC available.  Dyad will be transferred to [facility] - [place] provided [place] report to the [place] .    Maternal Data Has patient been taught Hand Expression?:  (mom independently hand expressed prior to re-latch) Does the patient have breastfeeding experience prior to this delivery?: Yes  Feeding Feeding Type: Breast Fed  LATCH Score Latch: Repeated attempts needed to sustain latch, nipple held in mouth throughout feeding, stimulation needed to elicit sucking reflex.  Audible Swallowing: A few with stimulation  Type of Nipple: Everted at rest and after stimulation  Comfort (Breast/Nipple): Soft / non-tender  Hold (Positioning): Assistance needed to correctly position infant at breast and maintain latch.  LATCH Score: 7  Interventions Interventions: Breast feeding basics reviewed;Assisted with latch;Skin to skin;Breast compression  Lactation Tools Discussed/Used     Consult Status Consult Status: Follow-up Date: 09/06/20 Follow-up type: In-patient    [place] 09/06/2020, 8:51 AM

## 2020-09-07 LAB — CBC
HCT: 28.8 % — ABNORMAL LOW (ref 36.0–46.0)
Hemoglobin: 10.4 g/dL — ABNORMAL LOW (ref 12.0–15.0)
MCH: 32.6 pg (ref 26.0–34.0)
MCHC: 36.1 g/dL — ABNORMAL HIGH (ref 30.0–36.0)
MCV: 90.3 fL (ref 80.0–100.0)
Platelets: 162 10*3/uL (ref 150–400)
RBC: 3.19 MIL/uL — ABNORMAL LOW (ref 3.87–5.11)
RDW: 13 % (ref 11.5–15.5)
WBC: 11.6 10*3/uL — ABNORMAL HIGH (ref 4.0–10.5)
nRBC: 0 % (ref 0.0–0.2)

## 2020-09-07 NOTE — Lactation Note (Signed)
This note was copied from a baby's chart. 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 Mom has been using her breast shells and she reported that swelling has improved and baby is now latching on. She also reported some nipple soreness.  Mom and baby are going home today, she's at 3% weight loss. Reviewed engorgement prevention/treatment, treatment/prevention of sore nipples and red flags on when to see baby's doctor. Per mom BF is going well and unlike her first baby, this baby is latching on.  Educated mom on LPI behavior due to baby's birth weight, she's aware that she'll have to continue pumping after feedings for a few more weeks until baby can full empty the breast. Reviewed pumping schedule, supply/demand and breastmilk storage guidelines.  Dad present and supportive. Parents are of [facility] OP services, mom will be taking baby to [facility] and will follow up with LC at that practice. Parents reported all questions and concerns were answered and will call PRN.   Maternal Data    Feeding Feeding Type: Breast Fed  LATCH Score                   Interventions Interventions: Breast feeding basics reviewed;Shells  Lactation Tools Discussed/Used Tools: Shells   Consult Status Consult Status: Complete Date: 09/07/20 Follow-up type: Call as needed    [place] 09/07/2020, 1:23 PM

## 2020-09-07 NOTE — Discharge Summary (Signed)
Obstetric Discharge Summary  Kristina Rhodes is a 29 y.o. female that presented on 09/05/2020 for IOL for gestational hypertension at 49 weeks.  She was admitted to labor and delivery for 2 stage IUOL.  Her labor course was uncomplicated and she delivered a viable female infant on 09/06/2020.  Her postpartum course was uncomplicated and on PPD#1, she reported well controlled pain, spontaneous voiding, ambulating without difficulty, and tolerating PO.  She was stable for discharge home on 09/07/2020 with plans for in-office follow up.  Hemoglobin  Date Value Ref Range Status  09/07/2020 10.4 (L) 12.0 - 15.0 g/dL Final   HCT  Date Value Ref Range Status  09/07/2020 28.8 (L) 36.0 - 46.0 % Final    Physical Exam:  General: alert and no distress Lochia: appropriate Uterine Fundus: firm DVT Evaluation: No evidence of DVT seen on physical exam.  Discharge Diagnoses: Term Pregnancy-delivered  Discharge Information: Date: 09/07/2020 Activity: pelvic rest Diet: routine Medications: Ibuprofen, Colace and Iron Condition: stable Instructions: refer to practice specific booklet Discharge to: home  Follow-up Information    [facility] Follow up.   Why: Please follow up for 6 week postpartum visit.  Contact information: [address] [place] [address] [phone]               Newborn Data: Live born female  Birth Weight: 5 lb 14.7 oz (2685 g) APGAR: 20, 9  Newborn Delivery   Birth date/time: 09/06/2020 07:28:00 Delivery type: Vaginal, Spontaneous      Home with mother.  Carlyon Shadow 09/07/2020, 8:49 AM

## 2020-09-07 NOTE — Progress Notes (Signed)
Postpartum Progress Note  Post Partum Day 1 s/p spontaneous vaginal delivery.  Patient reports well-controlled pain, ambulating without difficulty, voiding spontaneously, tolerating PO.  Vaginal bleeding is appropriate.   Objective: Blood pressure 132/70, pulse 83, temperature 98.7 F (37.1 C), temperature source Oral, resp. rate 16, weight 70.4 kg, SpO2 100 %, unknown if currently breastfeeding.  Physical Exam:  General: alert and no distress Lochia: appropriate Uterine Fundus: firm DVT Evaluation: No evidence of DVT seen on physical exam.  Recent Labs    09/05/20 1601 09/07/20 0446  HGB 11.7* 10.4*  HCT 32.2* 28.8*    Assessment/Plan: . Postpartum Day 1, s/p vaginal delivery. . Induction for gHTN, BP wnl since delivery. . Continue routine postpartum care . Lactation following . Anticipate discharge home today.  Pt to monitor BP at home.   LOS: 2 days   Carlyon Shadow 09/07/2020, 7:31 AM

## 2020-09-08 ENCOUNTER — Encounter: Payer: Self-pay | Admitting: Family Medicine

## 2020-09-08 NOTE — Anesthesia Postprocedure Evaluation (Signed)
Anesthesia Post Note  Patient: Lexicographer  Procedure(s) Performed: AN AD HOC LABOR EPIDURAL     Patient location during evaluation: Mother Baby Anesthesia Type: Epidural Level of consciousness: awake and alert Pain management: pain level controlled Vital Signs Assessment: post-procedure vital signs reviewed and stable Respiratory status: spontaneous breathing, nonlabored ventilation and respiratory function stable Cardiovascular status: stable Postop Assessment: no headache, no backache and epidural receding Anesthetic complications: no   No complications documented.  Last Vitals: There were no vitals filed for this visit.  Last Pain: There were no vitals filed for this visit.               [place]

## 2020-09-10 ENCOUNTER — Other Ambulatory Visit (HOSPITAL_COMMUNITY): Payer: BC Managed Care – PPO

## 2020-09-11 ENCOUNTER — Inpatient Hospital Stay (HOSPITAL_COMMUNITY): Payer: BC Managed Care – PPO

## 2020-10-10 DIAGNOSIS — Z1152 Encounter for screening for COVID-19: Secondary | ICD-10-CM | POA: Diagnosis not present

## 2020-10-30 DIAGNOSIS — Z1389 Encounter for screening for other disorder: Secondary | ICD-10-CM | POA: Diagnosis not present

## 2020-11-14 DIAGNOSIS — Z1159 Encounter for screening for other viral diseases: Secondary | ICD-10-CM | POA: Diagnosis not present

## 2021-01-07 DIAGNOSIS — Z03818 Encounter for observation for suspected exposure to other biological agents ruled out: Secondary | ICD-10-CM | POA: Diagnosis not present

## 2021-01-31 DIAGNOSIS — N9412 Deep dyspareunia: Secondary | ICD-10-CM | POA: Diagnosis not present

## 2021-01-31 DIAGNOSIS — M6281 Muscle weakness (generalized): Secondary | ICD-10-CM | POA: Diagnosis not present

## 2021-01-31 DIAGNOSIS — N393 Stress incontinence (female) (male): Secondary | ICD-10-CM | POA: Diagnosis not present

## 2021-02-05 DIAGNOSIS — Z03818 Encounter for observation for suspected exposure to other biological agents ruled out: Secondary | ICD-10-CM | POA: Diagnosis not present

## 2021-02-12 ENCOUNTER — Encounter: Payer: Self-pay | Admitting: Family Medicine

## 2021-02-28 ENCOUNTER — Telehealth: Payer: Self-pay | Admitting: Family Medicine

## 2021-02-28 NOTE — Telephone Encounter (Signed)
Unfortunately, there is nothing we can offer her as far as even a virtual appointment secondary to licensure standards for telemedicine.  I would encourage her to be seen in an urgent care in [place].  There are oral antivirals that now can be prescribed to treat COVID 19.  As far as over-the-counter products, the same product she would use for treatment of any other virus or flu is recommended.  Such as Tylenol, Advil, Mucinex DM or Delsym, sore throat lozenges etc.  As long as not contraindicated for her. She feels better soon   FYI: I did remove her from my panel.

## 2021-02-28 NOTE — Telephone Encounter (Signed)
Spoke with pt and informed her of providers instruction and wish her luck on new beginnings

## 2021-02-28 NOTE — Telephone Encounter (Signed)
Caller Name: Quinisha Call back phone #: [phone]  Reason for Call: Pt moved to [place] about 10 days ago. Pt had covid+ home test today but symptoms of congestion started Tuesday. Pt asking for advice. Provider would have to be licensed in [place] for virtual visit.  Pt had to disconnect and answer a call from pediatrician for her child.

## 2021-11-05 ENCOUNTER — Other Ambulatory Visit: Payer: Self-pay

## 2021-11-05 ENCOUNTER — Encounter: Payer: Self-pay | Admitting: Family Medicine

## 2021-11-05 ENCOUNTER — Ambulatory Visit (INDEPENDENT_AMBULATORY_CARE_PROVIDER_SITE_OTHER): Payer: BC Managed Care – PPO | Admitting: Family Medicine

## 2021-11-05 VITALS — BP 114/71 | HR 56 | Temp 98.4°F | Ht 60.5 in | Wt 123.0 lb

## 2021-11-05 DIAGNOSIS — R7989 Other specified abnormal findings of blood chemistry: Secondary | ICD-10-CM | POA: Diagnosis not present

## 2021-11-05 DIAGNOSIS — E210 Primary hyperparathyroidism: Secondary | ICD-10-CM | POA: Diagnosis not present

## 2021-11-05 DIAGNOSIS — D4989 Neoplasm of unspecified behavior of other specified sites: Secondary | ICD-10-CM | POA: Diagnosis not present

## 2021-11-05 DIAGNOSIS — Z1159 Encounter for screening for other viral diseases: Secondary | ICD-10-CM

## 2021-11-05 DIAGNOSIS — E559 Vitamin D deficiency, unspecified: Secondary | ICD-10-CM

## 2021-11-05 DIAGNOSIS — Z0000 Encounter for general adult medical examination without abnormal findings: Secondary | ICD-10-CM | POA: Diagnosis not present

## 2021-11-05 DIAGNOSIS — Z131 Encounter for screening for diabetes mellitus: Secondary | ICD-10-CM

## 2021-11-05 DIAGNOSIS — Q8789 Other specified congenital malformation syndromes, not elsewhere classified: Secondary | ICD-10-CM

## 2021-11-05 DIAGNOSIS — Z13220 Encounter for screening for lipoid disorders: Secondary | ICD-10-CM | POA: Diagnosis not present

## 2021-11-05 DIAGNOSIS — E892 Postprocedural hypoparathyroidism: Secondary | ICD-10-CM | POA: Diagnosis not present

## 2021-11-05 LAB — HEMOGLOBIN A1C: Hgb A1c MFr Bld: 5.3 % (ref 4.6–6.5)

## 2021-11-05 LAB — LIPID PANEL
Cholesterol: 175 mg/dL (ref 0–200)
HDL: 62.2 mg/dL (ref >39.00)
LDL Cholesterol: 102 mg/dL — ABNORMAL HIGH (ref 0–99)
NonHDL: 112.52
Total CHOL/HDL Ratio: 3
Triglycerides: 53 mg/dL (ref 0.0–149.0)
VLDL: 10.6 mg/dL (ref 0.0–40.0)

## 2021-11-05 LAB — CBC
HCT: 36.2 % (ref 36.0–46.0)
Hemoglobin: 12.4 g/dL (ref 12.0–15.0)
MCHC: 34.2 g/dL (ref 30.0–36.0)
MCV: 85.7 fl (ref 78.0–100.0)
Platelets: 256 10*3/uL (ref 150.0–400.0)
RBC: 4.22 Mil/uL (ref 3.87–5.11)
RDW: 13.2 % (ref 11.5–15.5)
WBC: 5.5 10*3/uL (ref 4.0–10.5)

## 2021-11-05 LAB — COMPREHENSIVE METABOLIC PANEL
ALT: 10 U/L (ref 0–35)
AST: 16 U/L (ref 0–37)
Albumin: 4.8 g/dL (ref 3.5–5.2)
Alkaline Phosphatase: 63 U/L (ref 39–117)
BUN: 16 mg/dL (ref 6–23)
CO2: 30 mEq/L (ref 19–32)
Calcium: 9.4 mg/dL (ref 8.4–10.5)
Chloride: 101 mEq/L (ref 96–112)
Creatinine, Ser: 0.84 mg/dL (ref 0.40–1.20)
GFR: 91.77 mL/min (ref >60.00)
Glucose, Bld: 78 mg/dL (ref 70–99)
Potassium: 4 mEq/L (ref 3.5–5.1)
Sodium: 138 mEq/L (ref 135–145)
Total Bilirubin: 0.5 mg/dL (ref 0.2–1.2)
Total Protein: 7.6 g/dL (ref 6.0–8.3)

## 2021-11-05 LAB — VITAMIN D 25 HYDROXY (VIT D DEFICIENCY, FRACTURES): VITD: 46.48 ng/mL (ref 30.00–100.00)

## 2021-11-05 LAB — TSH: TSH: 3.34 u[IU]/mL (ref 0.35–5.50)

## 2021-11-05 LAB — T4, FREE: Free T4: 0.79 ng/dL (ref 0.60–1.60)

## 2021-11-05 NOTE — Patient Instructions (Signed)

## 2021-11-05 NOTE — Progress Notes (Signed)
This visit occurred during the SARS-CoV-2 public health emergency.  Safety protocols were in place, including screening questions prior to the visit, additional usage of staff PPE, and extensive cleaning of exam room while observing appropriate contact time as indicated for disinfecting solutions.    Patient ID: Kristina Rhodes, female  DOB: 01-21-1991, 31 y.o.   MRN: [id number] Patient Care Team    Relationship Specialty Notifications Start End  Ma Hillock, DO PCP - General Family Medicine  08/30/21   Linda Hedges, DO Consulting Physician Obstetrics and Gynecology  02/13/17     Chief Complaint  Patient presents with   Annual Exam    Pt is fasting    Subjective: Kristina Rhodes is a 31 y.o.  Female  present for CPE  All past medical history, surgical history, allergies, family history, immunizations, medications and social history were updated in the electronic medical record today. All recent labs, ED visits and hospitalizations within the last year were reviewed.  Health maintenance:  Colonoscopy: No Fhx, Screen at 43 Mammogram: [place] present. Screen at 38, baseline prior.  Cervical cancer screening: has GYN, Dr. Lynnette Caffey UTD 01/2020 Immunizations: UTD tdap 2018, Influenza yearly encouraged UTD 10/22. Covid series completed Infectious disease screening: HIV completed.hep c agreeable to testing DEXA: Screen at 58 Assistive device: none Oxygen TFT:DDUK Patient has a Dental home. Hospitalizations/ED visits: reviewed  Depression screen [facility] 2/9 11/05/2021 05/06/2019 05/04/2018 01/06/2018 02/13/2017  Decreased Interest 0 0 0 0 0  Down, Depressed, Hopeless 0 0 0 0 0  PHQ - 2 Score 0 0 0 0 0   No flowsheet data found.   Immunization History  Administered Date(s) Administered   DTaP 04/20/1989, 07/08/1989, 09/03/1989, 08/30/1990, 06/24/1994   HPV Quadrivalent 09/16/2004, 11/21/2005, 03/18/2006   Hepatitis A 09/16/2005, 03/18/2006   Hepatitis A, Ped/Adol-2 Dose 09/16/2005, 03/18/2006    Hepatitis B 06/24/1994, 08/06/1994, 01/23/1995   Hepatitis B, ped/adol 06/24/1994, 08/06/1994, 01/23/1995   IPV 04/20/1989, 07/08/1989, 08/30/1990, 06/24/1994   Influenza Split 07/08/1989, 06/29/2013   Influenza, Seasonal, Injecte, Preservative Fre 06/29/2013   Influenza,inj,Quad PF,6+ Mos 07/18/2018, 07/15/2019   Influenza-Unspecified 06/29/2021   MMR 05/26/1990, 06/05/1994, 05/04/2018   Meningococcal Conjugate 03/18/2006   PFIZER(Purple Top)SARS-COV-2 Vaccination 11/27/2019, 12/17/2019   Pfizer Covid-19 Vaccine Bivalent Booster 20yr & up 06/14/2021   Td 12/13/2002   Tdap 03/22/2007, 02/13/2017, 07/10/2020   Varicella 08/06/1994, 03/08/2007    Past Medical History:  Diagnosis Date   Allergy    Anemia    Gestational hypertension 09/05/2020   Heart murmur    Hypercalcemia 05/06/2018   Hyperparathyroidism ([place]    Pregnancy induced hypertension 2021   [place] 10/21/2019   Allergies  Allergen Reactions   Amoxicillin Hives    Has patient had a PCN reaction causing immediate rash, facial/tongue/throat swelling, SOB or lightheadedness with hypotension: Yes Has patient had a PCN reaction causing severe rash involving mucus membranes or skin necrosis: No Has patient had a PCN reaction that required hospitalization: No Has patient had a PCN reaction occurring within the last 10 years: yes If all of the above answers are "NO", then may proceed with Cephalosporin use.   Keflex [Cephalexin] Hives   Penicillins Hives    Has patient had a PCN reaction causing immediate rash, facial/tongue/throat swelling, SOB or lightheadedness with hypotension: Yes Has patient had a PCN reaction causing severe rash involving mucus membranes or skin necrosis: No Has patient had a PCN reaction that required hospitalization: No Has patient had a PCN reaction occurring within  the last 10 years: yes If all of the above answers are "NO", then may proceed with Cephalosporin use.    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    Drinks caffeine. Herbal remedy use. Daily vitamin use.    Wears her seatbelt and bicycle helmet.    Exercises routinely.    Regular diet.    Smoke detector in the home. No firearms in the home.    Feels safe in her relationships.     Allergies as of 11/05/2021       Reactions   Amoxicillin Hives   Has patient had a PCN reaction causing immediate rash, facial/tongue/throat swelling, SOB or lightheadedness with hypotension: Yes Has patient had a PCN reaction causing severe rash involving mucus membranes or skin necrosis: No Has patient had a PCN reaction that required hospitalization: No Has patient had a PCN reaction occurring within the last 10 years: yes If all of the above answers are "NO", then may proceed with Cephalosporin use.   Keflex [cephalexin] Hives   Penicillins Hives   Has patient had a PCN reaction causing immediate rash, facial/tongue/throat swelling, SOB or lightheadedness with hypotension: Yes Has patient had a PCN reaction causing severe rash involving mucus membranes or skin necrosis: No Has patient had a PCN reaction that required hospitalization: No Has patient had a PCN reaction occurring within the last 10 years: yes If all of the above answers are "NO", then may proceed with Cephalosporin use.   Cephalosporins Rash        Medication  List        Accurate as of November 05, 2021  9:33 AM. If you have any questions, ask your nurse or doctor.          STOP taking these medications    prenatal multivitamin Tabs tablet Stopped by: Howard Pouch, DO       TAKE these medications    cholecalciferol 25 MCG (1000 UNIT) tablet Commonly known as: VITAMIN D3 Take 3,000 Units by mouth daily.   docusate sodium 250 MG capsule Commonly known as: COLACE Take 250 mg by mouth daily.   ferrous sulfate 324 MG Tbec Take 324 mg by mouth.   magnesium 30 MG tablet Take 30 mg by mouth 2 (two) times daily.   vitamin C 100 MG tablet Take 100 mg by mouth daily.   ZINC PO Take by mouth.        All past medical history, surgical history, allergies, family history, immunizations andmedications were updated in the EMR today and reviewed under the history and medication portions of their EMR.     No results found for this or any previous visit (from the past 2160 hour(s)).  No results found.  ROS 14 pt review of systems performed and negative (unless mentioned in an HPI)  Objective:  BP 114/71    Pulse (!) 56    Temp 98.4 F (36.9 C) (Oral)    Ht 5' 0.5" (1.537 m)    Wt 123 lb (55.8 kg)    SpO2 100%    Breastfeeding Yes    BMI 23.63 kg/m  Physical Exam Vitals and nursing note reviewed.  Constitutional:      General: She is not in acute distress.    Appearance: Normal appearance. She is normal weight. She is not ill-appearing or toxic-appearing.  HENT:     Head: Normocephalic and atraumatic.     Right Ear: Tympanic membrane, ear canal and external ear normal. There is no impacted cerumen.     Left Ear: Tympanic membrane, ear canal and external ear normal. There is no impacted cerumen.     Nose: No congestion or rhinorrhea.     Mouth/Throat:     Mouth: Mucous membranes are moist.     Pharynx: Oropharynx is clear. No oropharyngeal exudate or posterior oropharyngeal erythema.  Eyes:     General: No scleral icterus.        Right eye: No discharge.        Left eye: No discharge.     Extraocular Movements: Extraocular movements intact.     Conjunctiva/sclera: Conjunctivae normal.     Pupils: Pupils are equal, round, and reactive to light.  Cardiovascular:     Rate and Rhythm: Normal rate and regular rhythm.     Pulses: Normal pulses.     Heart sounds: Normal heart sounds. No murmur heard.   No friction rub. No gallop.  Pulmonary:     Effort: Pulmonary effort is normal. No respiratory distress.     Breath sounds: Normal breath sounds. No stridor. No wheezing, rhonchi or rales.  Chest:     Chest wall: No tenderness.  Abdominal:     General: Abdomen is flat. Bowel sounds are normal. There is no distension.     Palpations: Abdomen is soft. There is no mass.     Tenderness: There is no abdominal tenderness. There is no right CVA tenderness, left CVA tenderness, guarding or rebound.     Hernia: No hernia is present.  Musculoskeletal:        General: No swelling, tenderness or deformity. Normal range of motion.     Cervical back: Normal range of motion and neck supple. No rigidity or tenderness.     Right lower leg: No edema.     Left lower leg: No edema.  Lymphadenopathy:     Cervical: No cervical adenopathy.  Skin:    General: Skin is warm and dry.     Coloration: Skin is not jaundiced or pale.     Findings: No bruising, erythema, lesion or rash.  Neurological:     General: No focal deficit present.     Mental Status: She is alert and oriented to person, place, and time. Mental status is at baseline.     Cranial Nerves: No cranial nerve deficit.     Sensory: No sensory deficit.     Motor: No weakness.     Coordination: Coordination normal.     Gait: Gait normal.     Deep Tendon Reflexes: Reflexes normal.  Psychiatric:        Mood and Affect: Mood normal.        Behavior: Behavior normal.        Thought Content: Thought content normal.        Judgment: Judgment normal.  No results  found.  Assessment/plan: Kristina Rhodes is a 31 y.o. 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 Screen at 38, baseline prior.  Cervical cancer screening: has GYN, Dr. Lynnette Caffey UTD 01/2020 Immunizations: UTD tdap 2018, Influenza yearly encouraged UTD 10/22. Covid series completed Infectious disease screening: HIV completed.hep c agreeable to testing DEXA: Screen at 58 Patient was encouraged to exercise greater than 150 minutes a week. Patient was encouraged to choose a diet filled with fresh fruits and vegetables, and lean meats. AVS provided to patient today for education/recommendation on gender specific health and safety maintenance.  Return in about 1 year (around 11/06/2022) for CPE (30 min).  Orders Placed This Encounter  Procedures   CBC   TSH   Lipid panel   T4, free   Comprehensive metabolic panel   Hemoglobin A1c   Vitamin D (25 hydroxy)   Hepatitis C Antibody   No orders of the defined types were placed in this encounter.  Referral Orders  No referral(s) requested today     Electronically signed by: Howard Pouch, [place]

## 2021-11-06 LAB — HEPATITIS C ANTIBODY
Hepatitis C Ab: NONREACTIVE
SIGNAL TO CUT-OFF: <0.02 (ref <1.00)

## 2021-11-11 DIAGNOSIS — Z01419 Encounter for gynecological examination (general) (routine) without abnormal findings: Secondary | ICD-10-CM | POA: Diagnosis not present

## 2021-11-11 DIAGNOSIS — Z6824 Body mass index (BMI) 24.0-24.9, adult: Secondary | ICD-10-CM | POA: Diagnosis not present

## 2021-12-31 IMAGING — US US RENAL
1 series · 14 of 25 positions shown · non-contrast
Comparison: None.

CLINICAL DATA: Hyperparathyroidism-jaw tumor syndrome

EXAM:
RENAL / URINARY TRACT ULTRASOUND COMPLETE

[Series 1: us renal · 0.20mm/px · 14 of 30 slices shown]
[im 1/30]
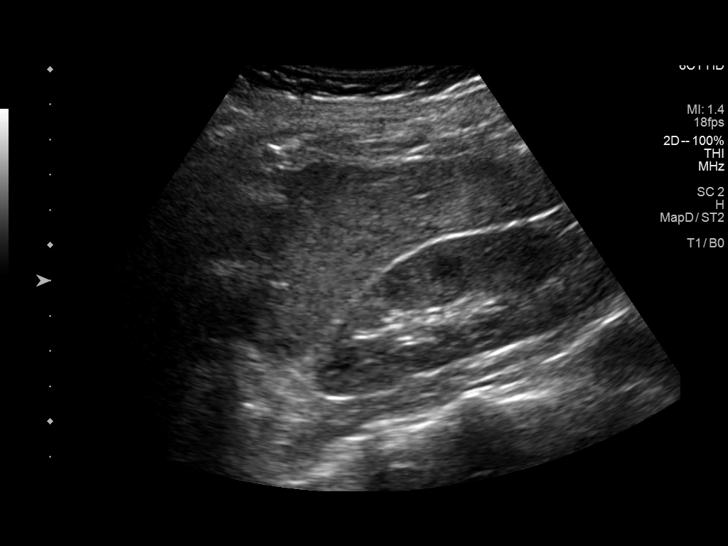
[im 3/30]
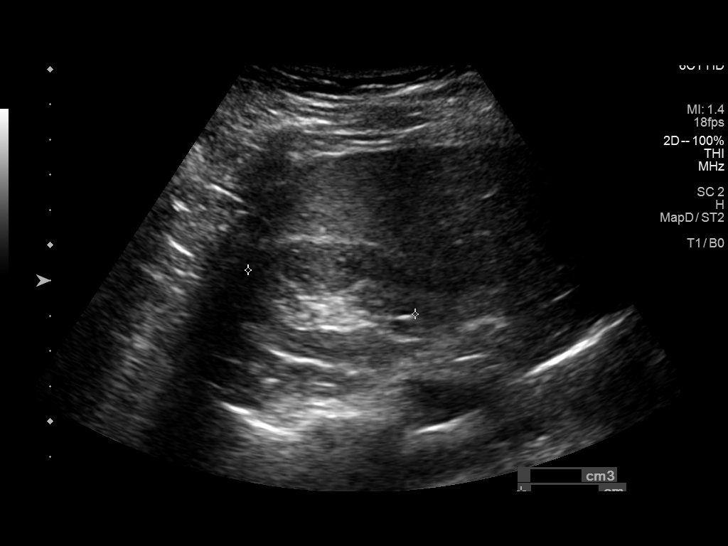
[im 5/30]
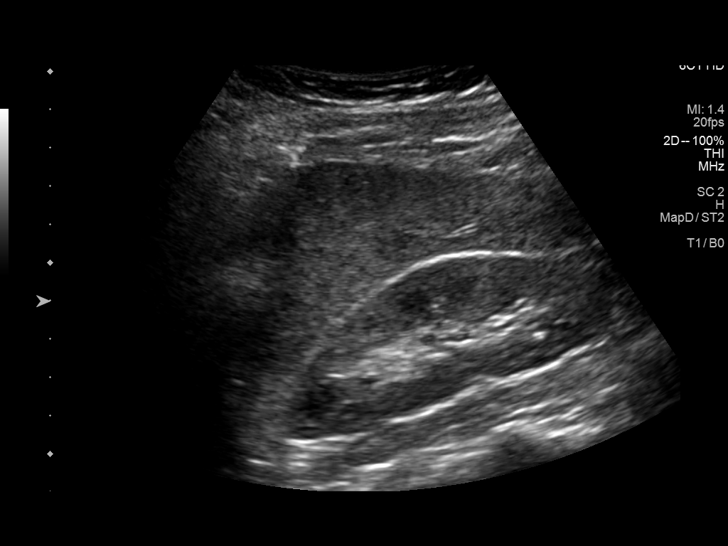
[im 8/30]
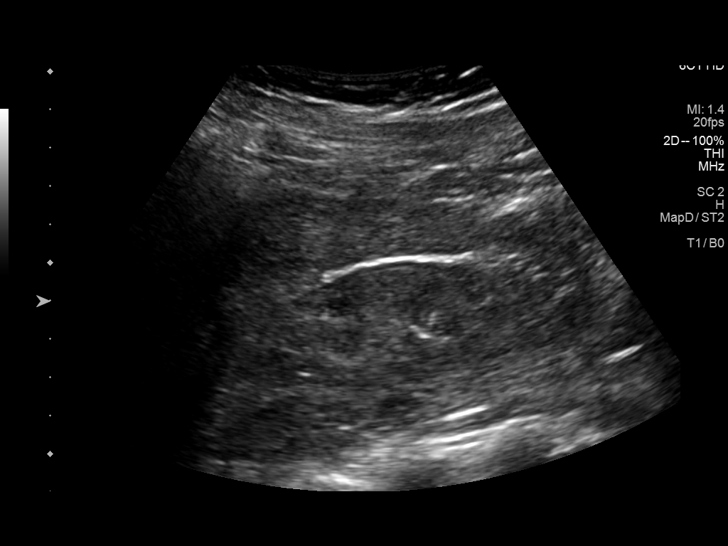
[im 10/30]
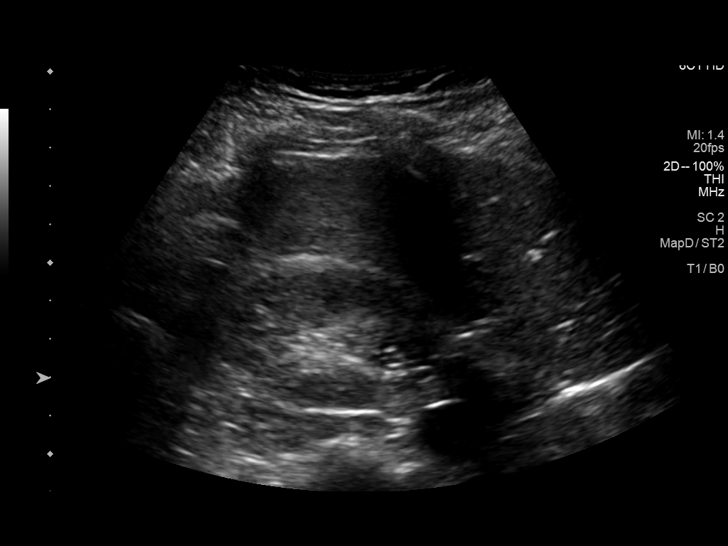
[im 11/30]
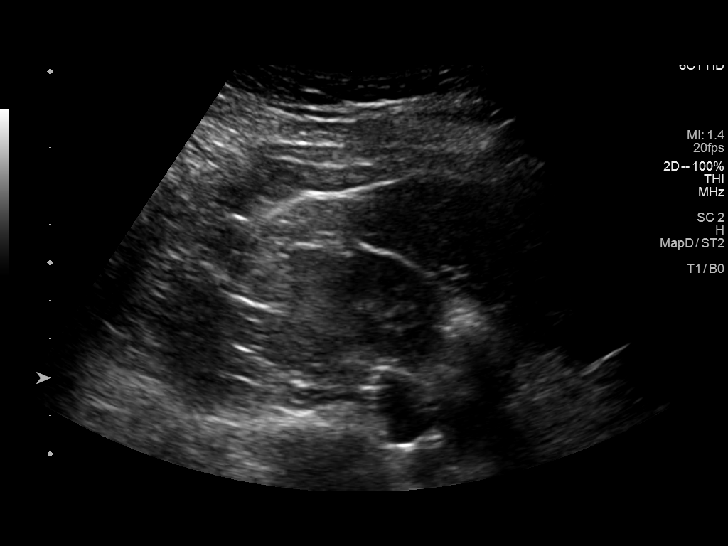
[im 14/30]
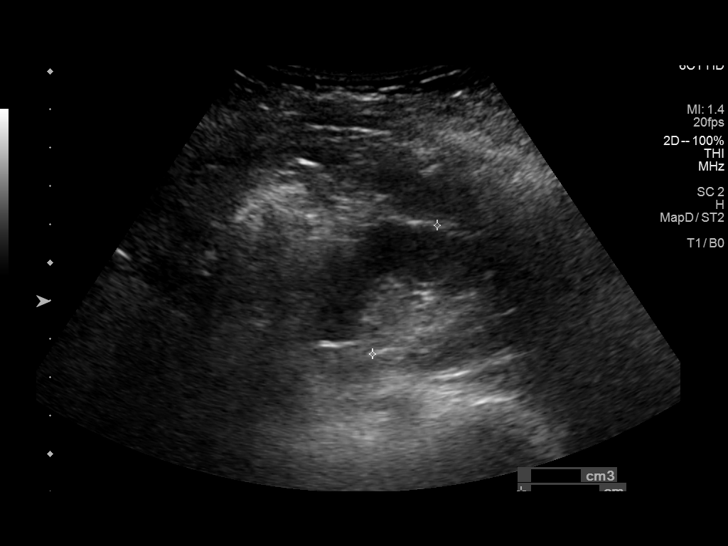
[im 16/30]
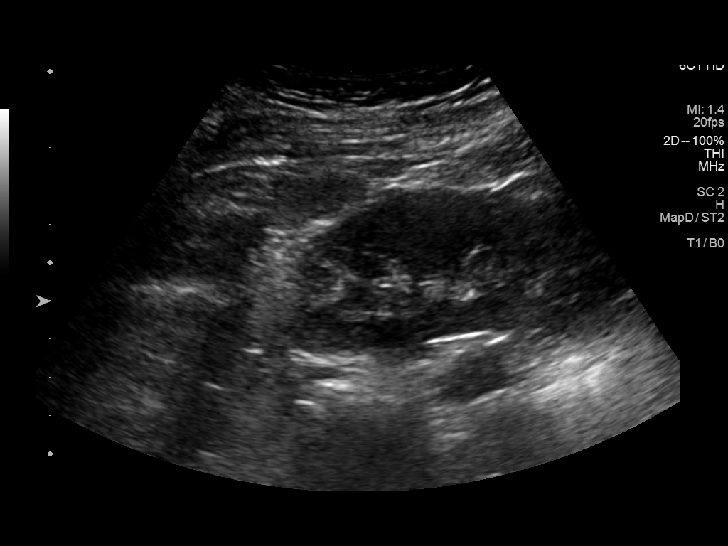
[im 19/30]
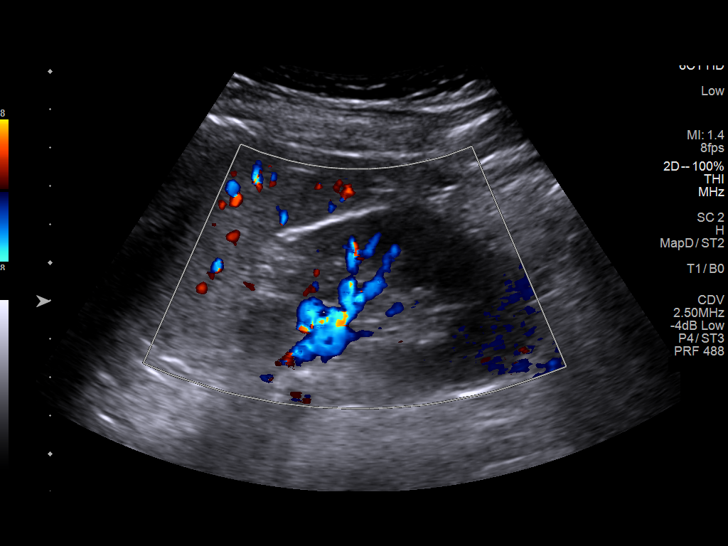
[im 20/30]
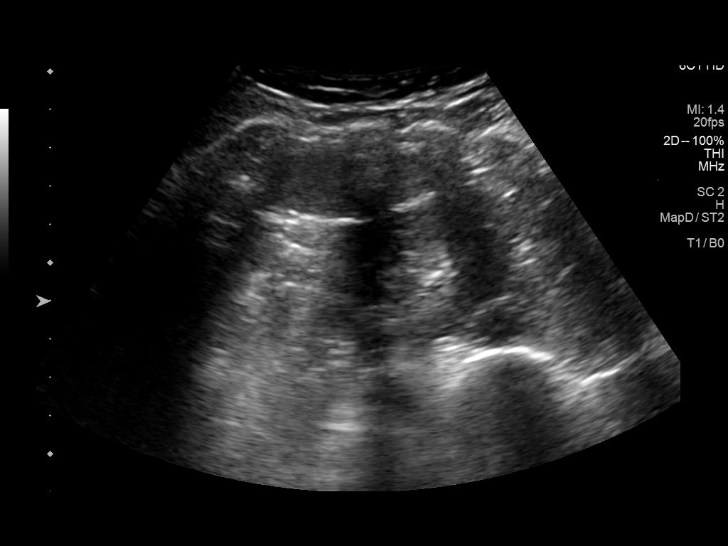
[im 22/30]
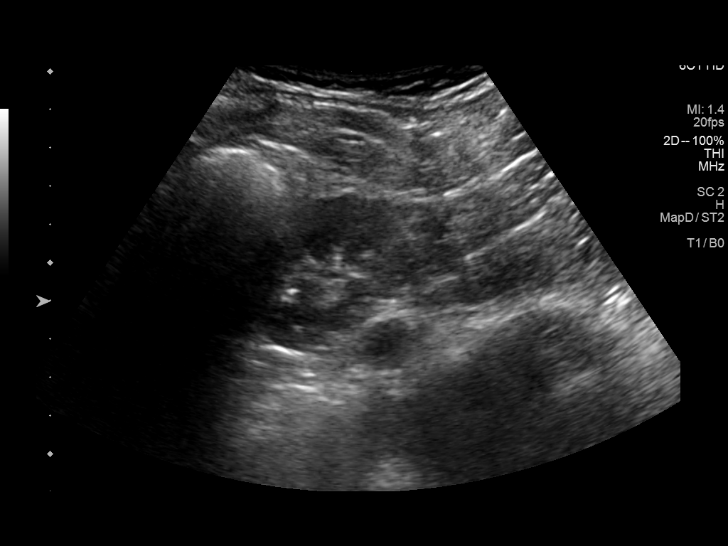
[im 25/30]
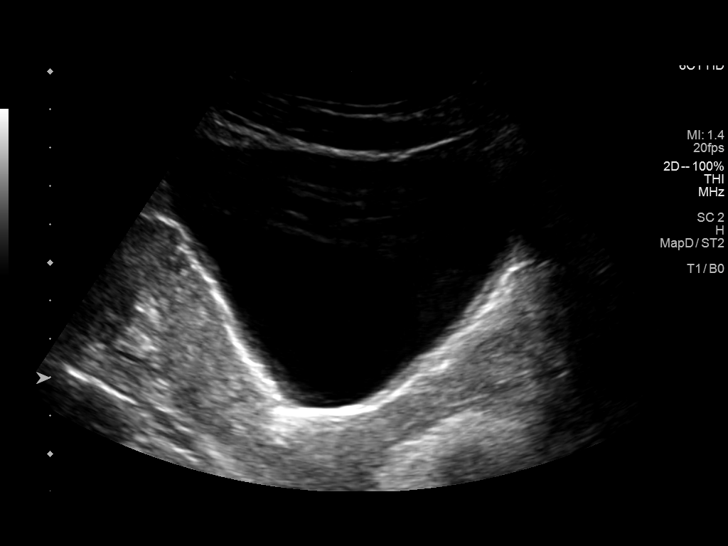
[im 27/30]
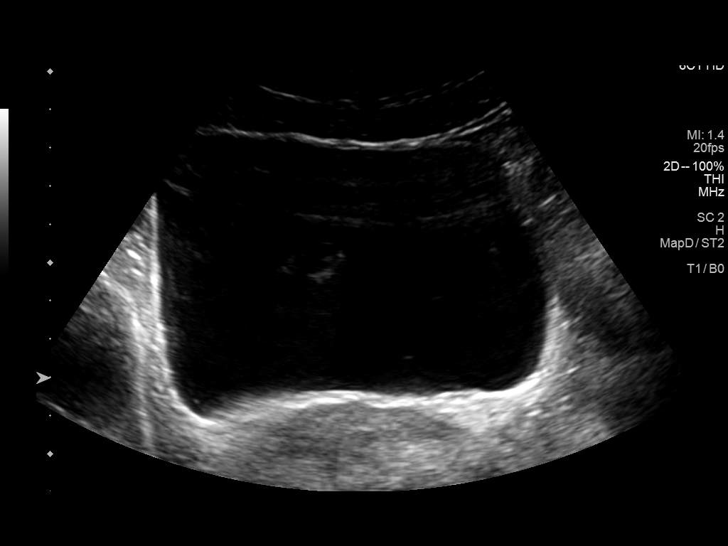
[im 30/30]
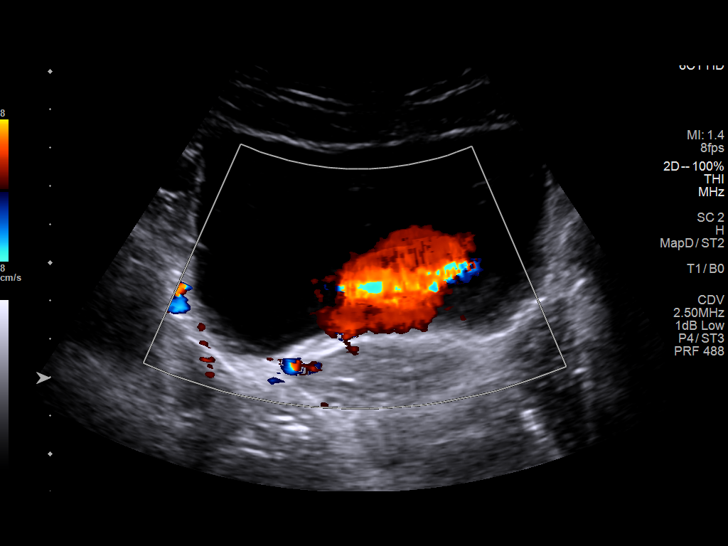

[14 of 25 positions shown; findings below may reference images not displayed]

FINDINGS: Right Kidney:

Renal measurements: 10.4 x 3.6 x 4.9 cm = volume: 97 mL .
Echogenicity within normal limits. No mass or hydronephrosis
visualized.

Left Kidney:

Renal measurements: 10.2 x 3.8 x 3.8 cm = volume: 77 mL.
Echogenicity within normal limits. No mass or hydronephrosis
visualized.

Bladder:

Appears normal for degree of bladder distention.
IMPRESSION: Normal bilateral renal ultrasound.

## 2022-08-06 ENCOUNTER — Encounter: Payer: Self-pay | Admitting: Family Medicine

## 2022-08-06 ENCOUNTER — Ambulatory Visit: Payer: BC Managed Care – PPO | Admitting: Family Medicine

## 2022-08-06 VITALS — BP 108/71 | HR 80 | Temp 98.3°F | Wt 131.2 lb

## 2022-08-06 DIAGNOSIS — H6123 Impacted cerumen, bilateral: Secondary | ICD-10-CM

## 2022-08-06 DIAGNOSIS — H938X9 Other specified disorders of ear, unspecified ear: Secondary | ICD-10-CM | POA: Diagnosis not present

## 2022-08-06 MED ORDER — NEOMYCIN-POLYMYXIN-HC 3.5-10000-1 OT SOLN: 4.0000 [drp] | Freq: Three times a day (TID) | OTIC | 0 refills | Status: AC

## 2022-08-06 NOTE — Progress Notes (Signed)
Kristina Rhodes , 1991-07-01, 31 y.o., female MRN: [id number] Patient Care Team    Relationship Specialty Notifications Start End  Ma Hillock, DO PCP - General Family Medicine  08/30/21   Linda Hedges, [place] Physician Obstetrics and Gynecology  02/13/17     Chief Complaint  Patient presents with   Ear Fullness     Subjective: Pt presents for an OV with complaints of ear fiullness-left.  Patient reports there is no ringing in the ear, no fevers or chills.  Sounds very muffled in her left ear.  She has used Q-tips recently.  She does use ear buds frequently.  She denies any pain.     11/05/2021    9:12 AM 05/06/2019    8:03 AM 05/04/2018    8:45 AM 01/06/2018    1:30 PM 02/13/2017    8:24 AM  Depression screen PHQ 2/9  Decreased Interest 0 0 0 0 0  Down, Depressed, Hopeless 0 0 0 0 0  PHQ - 2 Score 0 0 0 0 0    Allergies  Allergen Reactions   Amoxicillin Hives    Has patient had a PCN reaction causing immediate rash, facial/tongue/throat swelling, SOB or lightheadedness with hypotension: Yes Has patient had a PCN reaction causing severe rash involving mucus membranes or skin necrosis: No Has patient had a PCN reaction that required hospitalization: No Has patient had a PCN reaction occurring within the last 10 years: yes If all of the above answers are "NO", then may proceed with Cephalosporin use.   Keflex [Cephalexin] Hives   Penicillins Hives    Has patient had a PCN reaction causing immediate rash, facial/tongue/throat swelling, SOB or lightheadedness with hypotension: Yes Has patient had a PCN reaction causing severe rash involving mucus membranes or skin necrosis: No Has patient had a PCN reaction that required hospitalization: No Has patient had a PCN reaction occurring within the last 10 years: yes If all of the above answers are "NO", then may proceed with Cephalosporin use.   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    Drinks caffeine. Herbal remedy use. Daily vitamin use.    Wears her seatbelt and bicycle helmet.    Exercises routinely.    Regular diet.    Smoke detector in the home. No firearms in the home.    Feels safe in her relationships.    Past Medical History:  Diagnosis Date   Allergy    Anemia    Gestational hypertension 09/05/2020   Heart murmur    Hypercalcemia 05/06/2018   Hyperparathyroidism ([place])    Pregnancy induced hypertension 2021   Primary hyperparathyroidism ([place]) 10/21/2019   Past Surgical History:  Procedure Laterality Date   PARATHYROIDECTOMY Left    VAGINAL DELIVERY     WISDOM TOOTH EXTRACTION     Family History  Problem Relation Age of Onset   Skin cancer Mother    Asthma Mother    Breast cancer Paternal Aunt 69   Heart disease Paternal Grandmother    Breast cancer Cousin 3   Dementia Maternal Grandmother    Asthma Maternal Grandmother    Multiple sclerosis Cousin    Miscarriages / Stillbirths Paternal Aunt    Heart disease Paternal Grandfather    Allergies as of 08/06/2022       Reactions   Amoxicillin Hives   Has patient had a PCN  reaction causing immediate rash, facial/tongue/throat swelling, SOB or lightheadedness with hypotension: Yes Has patient had a PCN reaction causing severe rash involving mucus membranes or skin necrosis: No Has patient had a PCN reaction that required hospitalization: No Has patient had a PCN reaction occurring within the last 10 years: yes If all of the above answers are "NO", then may proceed with Cephalosporin use.   Keflex [cephalexin] Hives   Penicillins Hives   Has patient had a PCN reaction causing immediate rash, facial/tongue/throat swelling, SOB or lightheadedness with hypotension: Yes Has patient had a PCN reaction causing severe rash involving mucus membranes or skin necrosis: No Has patient had a PCN reaction that required hospitalization: No Has patient had a  PCN reaction occurring within the last 10 years: yes If all of the above answers are "NO", then may proceed with Cephalosporin use.   Cephalosporins Rash        Medication List        Accurate as of August 06, 2022  4:53 PM. If you have any questions, ask your nurse or doctor.          cholecalciferol 25 MCG (1000 UNIT) tablet Commonly known as: VITAMIN D3 Take 3,000 Units by mouth daily.   magnesium 30 MG tablet Take 30 mg by mouth 2 (two) times daily.   neomycin-polymyxin-hydrocortisone OTIC solution Commonly known as: CORTISPORIN Place 4 drops into both ears 3 (three) times daily for 7 days. Started by: Howard Pouch, DO   vitamin C 100 MG tablet Take 100 mg by mouth daily.   ZINC PO Take by mouth.        All past medical history, surgical history, allergies, family history, immunizations andmedications were updated in the EMR today and reviewed under the history and medication portions of their EMR.     ROS Negative, with the exception of above mentioned in HPI   Objective:  BP 108/71   Pulse 80   Temp 98.3 F (36.8 C)   Wt 131 lb 3.2 oz (59.5 kg)   SpO2 99%   BMI 25.20 kg/m  Body mass index is 25.2 kg/m. Physical Exam Vitals and nursing note reviewed.  Constitutional:      General: She is not in acute distress.    Appearance: Normal appearance. She is normal weight. She is not ill-appearing or toxic-appearing.  HENT:     Right Ear: There is impacted cerumen.     Left Ear: There is impacted cerumen.  Eyes:     Extraocular Movements: Extraocular movements intact.     Conjunctiva/sclera: Conjunctivae normal.     Pupils: Pupils are equal, round, and reactive to light.  Neurological:     Mental Status: She is alert and oriented to person, place, and time. Mental status is at baseline.  Psychiatric:        Mood and Affect: Mood normal.        Behavior: Behavior normal.        Thought Content: Thought content normal.        Judgment: Judgment  normal.     No results found. No results found. No results found for this or any previous visit (from the past 24 hour(s)).  Assessment/Plan: Kristina Rhodes is a 31 y.o. female present for OV for  Sensation of fullness in ear, unspecified laterality/bilateral cerumen impaction She encouraged her to pick up over-the-counter Debrox to finish flushing right ear. - Cortisporin otic drops bilateral ears for 7 days to help with inflammation and prevent  some infection.  Left ear has exudate in the canal possibly small amount of infection present -Procedure: Cerumen disimpaction Patient was verbally consented to procedure. Water-peroxide solution was applied and gentle ear lavage performed on bilateral ear(s).  There were no complications.  Tympanic membranes were visualized after disimpaction.  Tympanic membrane(s) intact without erythema.  Patient tolerated procedure well.  Patient reported relief of symptoms after removal of cerumen.   Reviewed expectations re: course of current medical issues. Discussed self-management of symptoms. Outlined signs and symptoms indicating need for more acute intervention. Patient verbalized understanding and all questions were answered. Patient received an After-Visit Summary.    No orders of the defined types were placed in this encounter.  Meds ordered this encounter  Medications   neomycin-polymyxin-hydrocortisone (CORTISPORIN) OTIC solution    Sig: Place 4 drops into both ears 3 (three) times daily for 7 days.    Dispense:  10 mL    Refill:  0   Referral Orders  No referral(s) requested today     Note is dictated utilizing voice recognition software. Although note has been proof read prior to signing, occasional typographical errors still can be missed. If any questions arise, please do not hesitate to call for verification.   electronically signed by:  Howard Pouch, DO  [place]

## 2022-08-06 NOTE — Patient Instructions (Addendum)
No follow-ups on file.        Great to see you today.  I have refilled the medication(s) we provide.   If labs were collected, we will inform you of lab results once received either by echart message or telephone call.   - echart message- for normal results that have been seen by the patient already.   - telephone call: abnormal results or if patient has not viewed results in their echart.  Earwax Buildup, Adult The ears produce a substance called earwax that helps keep bacteria out of the ear and protects the skin in the ear canal. Occasionally, earwax can build up in the ear and cause discomfort or hearing loss. What are the causes? This condition is caused by a buildup of earwax. Ear canals are self-cleaning. Ear wax is made in the outer part of the ear canal and generally falls out in small amounts over time. When the self-cleaning mechanism is not working, earwax builds up and can cause decreased hearing and discomfort. Attempting to clean ears with cotton swabs can push the earwax deep into the ear canal and cause decreased hearing and pain. What increases the risk? This condition is more likely to develop in people who: Clean their ears often with cotton swabs. Pick at their ears. Use earplugs or in-ear headphones often, or wear hearing aids. The following factors may also make you more likely to develop this condition: Being female. Being of older age. Naturally producing more earwax. Having narrow ear canals. Having earwax that is overly thick or sticky. Having excess hair in the ear canal. Having eczema. Being dehydrated. What are the signs or symptoms? Symptoms of this condition include: Reduced or muffled hearing. A feeling of fullness in the ear or feeling that the ear is plugged. Fluid coming from the ear. Ear pain or an itchy ear. Ringing in the ear. Coughing. Balance problems. An obvious piece of earwax that can be seen inside the ear canal. How is this  diagnosed? This condition may be diagnosed based on: Your symptoms. Your medical history. An ear exam. During the exam, your health care provider will look into your ear with an instrument called an otoscope. You may have tests, including a hearing test. How is this treated? This condition may be treated by: Using ear drops to soften the earwax. Having the earwax removed by a health care provider. The health care provider may: Flush the ear with water. Use an instrument that has a loop on the end (curette). Use a suction device. Having surgery to remove the wax buildup. This may be done in severe cases. Follow these instructions at home:  Take over-the-counter and prescription medicines only as told by your health care provider. Do not put any objects, including cotton swabs, into your ear. You can clean the opening of your ear canal with a washcloth or facial tissue. Follow instructions from your health care provider about cleaning your ears. Do not overclean your ears. Drink enough fluid to keep your urine pale yellow. This will help to thin the earwax. Keep all follow-up visits as told. If earwax builds up in your ears often or if you use hearing aids, consider seeing your health care provider for routine, preventive ear cleanings. Ask your health care provider how often you should schedule your cleanings. If you have hearing aids, clean them according to instructions from the manufacturer and your health care provider. Contact a health care provider if: You have ear pain. You develop a  fever. You have pus or other fluid coming from your ear. You have hearing loss. You have ringing in your ears that does not go away. You feel like the room is spinning (vertigo). Your symptoms do not improve with treatment. Get help right away if: You have bleeding from the affected ear. You have severe ear pain. Summary Earwax can build up in the ear and cause discomfort or hearing loss. The  most common symptoms of this condition include reduced or muffled hearing, a feeling of fullness in the ear, or feeling that the ear is plugged. This condition may be diagnosed based on your symptoms, your medical history, and an ear exam. This condition may be treated by using ear drops to soften the earwax or by having the earwax removed by a health care provider. Do not put any objects, including cotton swabs, into your ear. You can clean the opening of your ear canal with a washcloth or facial tissue. This information is not intended to replace advice given to you by your health care provider. Make sure you discuss any questions you have with your health care provider. Document Revised: 01/03/2020 Document Reviewed: 01/03/2020 Elsevier Patient Education  [place].

## 2022-11-06 ENCOUNTER — Ambulatory Visit: Payer: BC Managed Care – PPO | Admitting: Family Medicine

## 2022-11-06 ENCOUNTER — Encounter: Payer: Self-pay | Admitting: Family Medicine

## 2022-11-06 VITALS — BP 120/75 | HR 68 | Temp 98.7°F | Ht 60.5 in | Wt 127.0 lb

## 2022-11-06 DIAGNOSIS — Z0000 Encounter for general adult medical examination without abnormal findings: Secondary | ICD-10-CM | POA: Diagnosis not present

## 2022-11-06 DIAGNOSIS — Z8639 Personal history of other endocrine, nutritional and metabolic disease: Secondary | ICD-10-CM | POA: Insufficient documentation

## 2022-11-06 DIAGNOSIS — Z130 Encounter for screening for diseases of the blood and blood-forming organs and certain disorders involving the immune mechanism: Secondary | ICD-10-CM

## 2022-11-06 DIAGNOSIS — Z13220 Encounter for screening for lipoid disorders: Secondary | ICD-10-CM | POA: Diagnosis not present

## 2022-11-06 DIAGNOSIS — E892 Postprocedural hypoparathyroidism: Secondary | ICD-10-CM | POA: Diagnosis not present

## 2022-11-06 DIAGNOSIS — R7989 Other specified abnormal findings of blood chemistry: Secondary | ICD-10-CM

## 2022-11-06 DIAGNOSIS — Q8789 Other specified congenital malformation syndromes, not elsewhere classified: Secondary | ICD-10-CM

## 2022-11-06 DIAGNOSIS — Z131 Encounter for screening for diabetes mellitus: Secondary | ICD-10-CM | POA: Diagnosis not present

## 2022-11-06 DIAGNOSIS — Z9089 Acquired absence of other organs: Secondary | ICD-10-CM

## 2022-11-06 DIAGNOSIS — E210 Primary hyperparathyroidism: Secondary | ICD-10-CM

## 2022-11-06 DIAGNOSIS — A609 Anogenital herpesviral infection, unspecified: Secondary | ICD-10-CM | POA: Insufficient documentation

## 2022-11-06 DIAGNOSIS — E559 Vitamin D deficiency, unspecified: Secondary | ICD-10-CM | POA: Diagnosis not present

## 2022-11-06 DIAGNOSIS — R5383 Other fatigue: Secondary | ICD-10-CM

## 2022-11-06 DIAGNOSIS — D4989 Neoplasm of unspecified behavior of other specified sites: Secondary | ICD-10-CM

## 2022-11-06 LAB — CBC WITH DIFFERENTIAL/PLATELET
Basophils Absolute: 0 10*3/uL (ref 0.0–0.1)
Basophils Relative: 0.5 % (ref 0.0–3.0)
Eosinophils Absolute: 0.1 10*3/uL (ref 0.0–0.7)
Eosinophils Relative: 1.2 % (ref 0.0–5.0)
HCT: 37.4 % (ref 36.0–46.0)
Hemoglobin: 13.1 g/dL (ref 12.0–15.0)
Lymphocytes Relative: 55.1 % — ABNORMAL HIGH (ref 12.0–46.0)
Lymphs Abs: 4.7 10*3/uL — ABNORMAL HIGH (ref 0.7–4.0)
MCHC: 34.9 g/dL (ref 30.0–36.0)
MCV: 85.9 fl (ref 78.0–100.0)
Monocytes Absolute: 0.5 10*3/uL (ref 0.1–1.0)
Monocytes Relative: 5.3 % (ref 3.0–12.0)
Neutro Abs: 3.2 10*3/uL (ref 1.4–7.7)
Neutrophils Relative %: 37.9 % — ABNORMAL LOW (ref 43.0–77.0)
Platelets: 294 10*3/uL (ref 150.0–400.0)
RBC: 4.36 Mil/uL (ref 3.87–5.11)
RDW: 12.5 % (ref 11.5–15.5)
WBC: 8.5 10*3/uL (ref 4.0–10.5)

## 2022-11-06 LAB — LIPID PANEL
Cholesterol: 183 mg/dL (ref 0–200)
HDL: 54.5 mg/dL (ref >39.00)
LDL Cholesterol: 116 mg/dL — ABNORMAL HIGH (ref 0–99)
NonHDL: 128.15
Total CHOL/HDL Ratio: 3
Triglycerides: 63 mg/dL (ref 0.0–149.0)
VLDL: 12.6 mg/dL (ref 0.0–40.0)

## 2022-11-06 LAB — COMPREHENSIVE METABOLIC PANEL
ALT: 15 U/L (ref 0–35)
AST: 20 U/L (ref 0–37)
Albumin: 4.6 g/dL (ref 3.5–5.2)
Alkaline Phosphatase: 41 U/L (ref 39–117)
BUN: 13 mg/dL (ref 6–23)
CO2: 26 mEq/L (ref 19–32)
Calcium: 9.4 mg/dL (ref 8.4–10.5)
Chloride: 102 mEq/L (ref 96–112)
Creatinine, Ser: 0.87 mg/dL (ref 0.40–1.20)
GFR: 87.37 mL/min (ref >60.00)
Glucose, Bld: 86 mg/dL (ref 70–99)
Potassium: 4.3 mEq/L (ref 3.5–5.1)
Sodium: 137 mEq/L (ref 135–145)
Total Bilirubin: 0.7 mg/dL (ref 0.2–1.2)
Total Protein: 7.5 g/dL (ref 6.0–8.3)

## 2022-11-06 LAB — T4, FREE: Free T4: 0.84 ng/dL (ref 0.60–1.60)

## 2022-11-06 LAB — TSH: TSH: 3.49 u[IU]/mL (ref 0.35–5.50)

## 2022-11-06 LAB — HEMOGLOBIN A1C: Hgb A1c MFr Bld: 5.5 % (ref 4.6–6.5)

## 2022-11-06 LAB — VITAMIN D 25 HYDROXY (VIT D DEFICIENCY, FRACTURES): VITD: 47.45 ng/mL (ref 30.00–100.00)

## 2022-11-06 NOTE — Patient Instructions (Addendum)
Return in about 1 year (around 11/08/2023).        Great to see you today.  I have refilled the medication(s) we provide.   If labs were collected, we will inform you of lab results once received either by echart message or telephone call.   - echart message- for normal results that have been seen by the patient already.   - telephone call: abnormal results or if patient has not viewed results in their echart.

## 2022-11-06 NOTE — Progress Notes (Signed)
Patient ID: Kristina Rhodes, female  DOB: 26-Oct-1990, 32 y.o.   MRN: [id number] Patient Care Team    Relationship Specialty Notifications Start End  Ma Hillock, DO PCP - General Family Medicine  08/30/21   Linda Hedges, DO Consulting Physician Obstetrics and Gynecology  02/13/17     Chief Complaint  Patient presents with   Annual Exam    [facility]; pt is fasting    Subjective: Kristina Rhodes is a 32 y.o.  Female  present for CPE  All past medical history, surgical history, allergies, family history, immunizations, medications and social history were updated in the electronic medical record today. All recent labs, ED visits and hospitalizations within the last year were reviewed.  Health maintenance:  Colonoscopy: No Fhx, Screen at 43 Mammogram: [place] present. Screen at 38, baseline prior.  Cervical cancer screening: has GYN, Dr. Lynnette Caffey UTD 01/2020 Immunizations: UTD tdap 2021, Influenza yearly encouraged . HPV series completed Infectious disease screening: HIV completed.hep c completed DEXA:routine screen Patient has a Dental home. Hospitalizations/ED visits: reviewed     11/06/2022    8:33 AM 11/05/2021    9:12 AM 05/06/2019    8:03 AM 05/04/2018    8:45 AM 01/06/2018    1:30 PM  Depression screen PHQ 2/9  Decreased Interest 0 0 0 0 0  Down, Depressed, Hopeless 0 0 0 0 0  PHQ - 2 Score 0 0 0 0 0  Altered sleeping 0      Tired, decreased energy 2      Feeling bad or failure about yourself  0      Moving slowly or fidgety/restless 0      Suicidal thoughts 0      PHQ-9 Score 2          11/06/2022    8:34 AM  GAD 7 : Generalized Anxiety Score  Control/stop worrying 0  Worry too much - different things 1  Trouble relaxing 0  Restless 0  Easily annoyed or irritable 1  Afraid - awful might happen 0     Immunization History  Administered Date(s) Administered   DTaP 04/20/1989, 07/08/1989, 09/03/1989, 08/30/1990, 06/24/1994   HPV Quadrivalent 09/16/2004, 11/21/2005, 03/18/2006    Hepatitis A 09/16/2005, 03/18/2006   Hepatitis A, Ped/Adol-2 Dose 09/16/2005, 03/18/2006   Hepatitis B 06/24/1994, 08/06/1994, 01/23/1995   Hepatitis B, PED/ADOLESCENT 06/24/1994, 08/06/1994, 01/23/1995   IPV 04/20/1989, 07/08/1989, 08/30/1990, 06/24/1994   Influenza Split 07/08/1989, 06/29/2013   Influenza, Seasonal, Injecte, Preservative Fre 06/29/2013   Influenza,inj,Quad PF,6+ Mos 07/18/2018, 07/15/2019, 06/29/2022   Influenza-Unspecified 06/29/2021   MMR 05/26/1990, 06/05/1994, 05/04/2018   Meningococcal Conjugate 03/18/2006   PFIZER(Purple Top)SARS-COV-2 Vaccination 11/27/2019, 12/17/2019   Pfizer Covid-19 Vaccine Bivalent Booster 5yr & up 06/14/2021   Td 12/13/2002   Tdap 03/22/2007, 02/13/2017, 07/10/2020   Unspecified SARS-COV-2 Vaccination 06/29/2022   Varicella 08/06/1994, 03/08/2007    Past Medical History:  Diagnosis Date   Allergy    Anemia    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   questionable dx but will ppx at 36w   Gestational hypertension 09/05/2020   Heart murmur    Hypercalcemia 05/06/2018   [place]    Pregnancy induced hypertension 2021   Primary hyperparathyroidism ([place] 10/21/2019   Allergies  Allergen Reactions   Amoxicillin Hives    Has patient had a PCN reaction causing immediate rash, facial/tongue/throat swelling, SOB or lightheadedness with hypotension: Yes Has patient had a PCN reaction causing severe rash involving mucus membranes or skin necrosis:  No Has patient had a PCN reaction that required hospitalization: No Has patient had a PCN reaction occurring within the last 10 years: yes If all of the above answers are "NO", then may proceed with Cephalosporin use.   Keflex [Cephalexin] Hives   Penicillins Hives    Has patient had a PCN reaction causing immediate rash, facial/tongue/throat swelling, SOB or lightheadedness with hypotension: Yes Has patient had a PCN reaction causing severe rash involving mucus membranes  or skin necrosis: No Has patient had a PCN reaction that required hospitalization: No Has patient had a PCN reaction occurring within the last 10 years: yes If all of the above answers are "NO", then may proceed with Cephalosporin use.   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    Drinks caffeine. Herbal remedy use. Daily vitamin use.    Wears her seatbelt and bicycle helmet.    Exercises routinely.    Regular diet.    Smoke detector in the home. No firearms in the home.    Feels safe in her relationships.     Allergies as of 11/06/2022       Reactions   Amoxicillin Hives   Has patient had a PCN reaction causing immediate rash, facial/tongue/throat swelling, SOB or lightheadedness with hypotension: Yes Has patient had a PCN reaction causing severe rash involving mucus membranes or skin necrosis: No Has patient had a PCN reaction that required hospitalization: No Has patient had a PCN reaction occurring within the last 10 years: yes If all of the above answers are "NO", then may proceed with Cephalosporin use.   Keflex [cephalexin] Hives   Penicillins Hives   Has patient had a PCN reaction causing immediate rash, facial/tongue/throat swelling, SOB or lightheadedness with hypotension: Yes Has patient had a PCN reaction causing severe rash involving mucus membranes or skin necrosis: No Has patient had a PCN  reaction that required hospitalization: No Has patient had a PCN reaction occurring within the last 10 years: yes If all of the above answers are "NO", then may proceed with Cephalosporin use.   Cephalosporins Rash        Medication List        Accurate as of November 06, 2022  8:44 AM. If you have any questions, ask your nurse or doctor.          cholecalciferol 25 MCG (1000 UNIT) tablet Commonly known as: VITAMIN D3 Take 3,000 Units by mouth daily.   magnesium 30 MG tablet Take 30 mg by mouth 2 (two) times daily.   prenatal multivitamin Tabs tablet Take 1 tablet by mouth daily at 12 noon.   vitamin C 100 MG tablet Take 100 mg by mouth daily.   ZINC PO Take by mouth.        All past medical history, surgical history, allergies,  family history, immunizations andmedications were updated in the EMR today and reviewed under the history and medication portions of their EMR.     No results found for this or any previous visit (from the past 2160 hour(s)).  No results found.  ROS 14 pt review of systems performed and negative (unless mentioned in an HPI)  Objective: BP 120/75   Pulse 68   Temp 98.7 F (37.1 C)   Ht 5' 0.5" (1.537 m)   Wt 127 lb (57.6 kg)   SpO2 100%   BMI 24.39 kg/m  Physical Exam Vitals and nursing note reviewed.  Constitutional:      General: She is not in acute distress.    Appearance: Normal appearance. She is not ill-appearing or toxic-appearing.  HENT:     Head: Normocephalic and atraumatic.     Right Ear: Tympanic membrane, ear canal and external ear normal. There is no impacted cerumen.     Left Ear: Tympanic membrane, ear canal and external ear normal. There is no impacted cerumen.     Nose: No congestion or rhinorrhea.     Mouth/Throat:     Mouth: Mucous membranes are moist.     Pharynx: Oropharynx is clear. No oropharyngeal exudate or posterior oropharyngeal erythema.  Eyes:     General: No scleral icterus.       Right eye:  No discharge.        Left eye: No discharge.     Extraocular Movements: Extraocular movements intact.     Conjunctiva/sclera: Conjunctivae normal.     Pupils: Pupils are equal, round, and reactive to light.  Cardiovascular:     Rate and Rhythm: Normal rate and regular rhythm.     Pulses: Normal pulses.     Heart sounds: Normal heart sounds. No murmur heard.    No friction rub. No gallop.  Pulmonary:     Effort: Pulmonary effort is normal. No respiratory distress.     Breath sounds: Normal breath sounds. No stridor. No wheezing, rhonchi or rales.  Chest:     Chest wall: No tenderness.  Abdominal:     General: Abdomen is flat. Bowel sounds are normal. There is no distension.     Palpations: Abdomen is soft. There is no mass.     Tenderness: There is no abdominal tenderness. There is no right CVA tenderness, left CVA tenderness, guarding or rebound.     Hernia: No hernia is present.  Musculoskeletal:        General: No swelling, tenderness or deformity. Normal range of motion.     Cervical back: Normal range of motion and neck supple. No rigidity or tenderness.     Right lower leg: No edema.     Left lower leg: No edema.  Lymphadenopathy:     Cervical: No cervical adenopathy.  Skin:    General: Skin is warm and dry.     Coloration: Skin is not jaundiced or pale.     Findings: No bruising, erythema, lesion or rash.  Neurological:     General: No focal deficit present.     Mental Status: She is alert and oriented to person, place, and time. Mental status is at baseline.     Cranial Nerves: No cranial nerve deficit.     Sensory: No sensory deficit.     Motor: No weakness.     Coordination: Coordination normal.     Gait: Gait normal.     Deep Tendon Reflexes: Reflexes normal.  Psychiatric:  Mood and Affect: Mood normal.        Behavior: Behavior normal.        Thought Content: Thought content normal.        Judgment: Judgment normal.      No results  found.  Assessment/plan: Karlina Suares is a 32 y.o. 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 Patient was encouraged to choose a diet filled with fresh fruits and vegetables, and lean meats. AVS provided to patient today for education/recommendation on gender specific health and safety maintenance. Colonoscopy: No Fhx, Screen at 43 Mammogram: [place] present. Screen at 38, baseline prior.  Cervical cancer screening: has GYN, Dr. Lynnette Caffey UTD 01/2020 Immunizations: UTD tdap 2021, Influenza yearly encouraged . HPV series completed Infectious disease screening: HIV completed.hep c completed DEXA:routine screen  Return in about 1 year (around 11/08/2023).  Orders Placed This Encounter  Procedures   CBC with Differential/Platelet   Comprehensive metabolic panel   Hemoglobin A1c   Lipid panel   TSH   Vitamin D (25 hydroxy)   T4, free   PTH, Intact and Calcium   No orders of the defined types were placed in this encounter.  Referral Orders  No referral(s) requested today    Electronically signed by: Howard Pouch, [place]

## 2022-11-07 ENCOUNTER — Telehealth: Payer: Self-pay | Admitting: Family Medicine

## 2022-11-07 DIAGNOSIS — D72820 Lymphocytosis (symptomatic): Secondary | ICD-10-CM

## 2022-11-07 LAB — PTH, INTACT AND CALCIUM
Calcium: 9.6 mg/dL (ref 8.6–10.2)
PTH: 20 pg/mL (ref 16–77)

## 2022-11-07 NOTE — Telephone Encounter (Signed)
Please inform patient: Liver, kidney, thyroid and parathyroid functions are normal. Her cholesterol panel looks great and is at goal. Vitamin D levels are normal. A1c/diabetes screening is normal.  She did have a mild elevation in her lymphocytes, which is a type of white blood cell.  If I recall she had stated she had been ill recently.  This is most likely a result of that illness.  However, to be certain I would encourage her to make a lab appointment only in 4 weeks to recheck these counts to ensure they return to normal.

## 2022-11-07 NOTE — Telephone Encounter (Signed)
LM for pt to return call to discuss.  

## 2022-11-10 ENCOUNTER — Encounter: Payer: Self-pay | Admitting: Family Medicine

## 2022-11-10 NOTE — Telephone Encounter (Signed)
Spoke with patient regarding results/recommendations.  

## 2022-11-10 NOTE — Telephone Encounter (Signed)
LM for pt to return call to discuss.  

## 2022-12-01 ENCOUNTER — Encounter: Payer: Self-pay | Admitting: Family Medicine

## 2022-12-04 ENCOUNTER — Other Ambulatory Visit: Payer: BC Managed Care – PPO

## 2022-12-31 ENCOUNTER — Other Ambulatory Visit (INDEPENDENT_AMBULATORY_CARE_PROVIDER_SITE_OTHER): Payer: BC Managed Care – PPO

## 2022-12-31 DIAGNOSIS — D72820 Lymphocytosis (symptomatic): Secondary | ICD-10-CM | POA: Diagnosis not present

## 2022-12-31 NOTE — Progress Notes (Signed)
Pt came for repeat lab, tolerated lab draw well.

## 2023-01-01 LAB — CBC WITH DIFFERENTIAL/PLATELET
Basophils Absolute: 0 10*3/uL (ref 0.0–0.1)
Basophils Relative: 0.5 % (ref 0.0–3.0)
Eosinophils Absolute: 0.1 10*3/uL (ref 0.0–0.7)
Eosinophils Relative: 1.5 % (ref 0.0–5.0)
HCT: 35.2 % — ABNORMAL LOW (ref 36.0–46.0)
Hemoglobin: 12.3 g/dL (ref 12.0–15.0)
Lymphocytes Relative: 47.2 % — ABNORMAL HIGH (ref 12.0–46.0)
Lymphs Abs: 2.6 10*3/uL (ref 0.7–4.0)
MCHC: 34.9 g/dL (ref 30.0–36.0)
MCV: 87.6 fl (ref 78.0–100.0)
Monocytes Absolute: 0.3 10*3/uL (ref 0.1–1.0)
Monocytes Relative: 5.1 % (ref 3.0–12.0)
Neutro Abs: 2.6 10*3/uL (ref 1.4–7.7)
Neutrophils Relative %: 45.7 % (ref 43.0–77.0)
Platelets: 245 10*3/uL (ref 150.0–400.0)
RBC: 4.02 Mil/uL (ref 3.87–5.11)
RDW: 13.2 % (ref 11.5–15.5)
WBC: 5.6 10*3/uL (ref 4.0–10.5)

## 2023-02-19 LAB — HM PAP SMEAR
Chlamydia, Swab/Urine, PCR: NEGATIVE
HPV, high-risk: NEGATIVE

## 2023-02-19 LAB — RESULTS CONSOLE HPV: CHL HPV: NEGATIVE

## 2023-11-09 ENCOUNTER — Ambulatory Visit: Payer: 59 | Admitting: Family Medicine

## 2023-11-09 ENCOUNTER — Encounter: Payer: Self-pay | Admitting: Family Medicine

## 2023-11-09 VITALS — BP 118/74 | HR 76 | Temp 98.0°F | Ht 61.0 in | Wt 135.6 lb

## 2023-11-09 DIAGNOSIS — Z9889 Other specified postprocedural states: Secondary | ICD-10-CM | POA: Diagnosis not present

## 2023-11-09 DIAGNOSIS — E663 Overweight: Secondary | ICD-10-CM | POA: Diagnosis not present

## 2023-11-09 DIAGNOSIS — E210 Primary hyperparathyroidism: Secondary | ICD-10-CM

## 2023-11-09 DIAGNOSIS — Q8789 Other specified congenital malformation syndromes, not elsewhere classified: Secondary | ICD-10-CM

## 2023-11-09 DIAGNOSIS — Z23 Encounter for immunization: Secondary | ICD-10-CM

## 2023-11-09 DIAGNOSIS — D4989 Neoplasm of unspecified behavior of other specified sites: Secondary | ICD-10-CM | POA: Diagnosis not present

## 2023-11-09 DIAGNOSIS — E559 Vitamin D deficiency, unspecified: Secondary | ICD-10-CM | POA: Diagnosis not present

## 2023-11-09 DIAGNOSIS — Z0000 Encounter for general adult medical examination without abnormal findings: Secondary | ICD-10-CM | POA: Diagnosis not present

## 2023-11-09 DIAGNOSIS — Z13220 Encounter for screening for lipoid disorders: Secondary | ICD-10-CM

## 2023-11-09 DIAGNOSIS — Z9089 Acquired absence of other organs: Secondary | ICD-10-CM

## 2023-11-09 LAB — LIPID PANEL
Cholesterol: 166 mg/dL (ref 0–200)
HDL: 67.5 mg/dL (ref >39.00)
LDL Cholesterol: 85 mg/dL (ref 0–99)
NonHDL: 98.15
Total CHOL/HDL Ratio: 2
Triglycerides: 68 mg/dL (ref 0.0–149.0)
VLDL: 13.6 mg/dL (ref 0.0–40.0)

## 2023-11-09 LAB — CBC
HCT: 37.4 % (ref 36.0–46.0)
Hemoglobin: 12.6 g/dL (ref 12.0–15.0)
MCHC: 33.7 g/dL (ref 30.0–36.0)
MCV: 90.2 fL (ref 78.0–100.0)
Platelets: 263 10*3/uL (ref 150.0–400.0)
RBC: 4.14 Mil/uL (ref 3.87–5.11)
RDW: 12.8 % (ref 11.5–15.5)
WBC: 6.8 10*3/uL (ref 4.0–10.5)

## 2023-11-09 LAB — COMPREHENSIVE METABOLIC PANEL
ALT: 17 U/L (ref 0–35)
AST: 19 U/L (ref 0–37)
Albumin: 4.6 g/dL (ref 3.5–5.2)
Alkaline Phosphatase: 42 U/L (ref 39–117)
BUN: 12 mg/dL (ref 6–23)
CO2: 27 meq/L (ref 19–32)
Calcium: 9 mg/dL (ref 8.4–10.5)
Chloride: 105 meq/L (ref 96–112)
Creatinine, Ser: 0.84 mg/dL (ref 0.40–1.20)
GFR: 90.48 mL/min (ref >60.00)
Glucose, Bld: 84 mg/dL (ref 70–99)
Potassium: 4.1 meq/L (ref 3.5–5.1)
Sodium: 140 meq/L (ref 135–145)
Total Bilirubin: 0.4 mg/dL (ref 0.2–1.2)
Total Protein: 7.2 g/dL (ref 6.0–8.3)

## 2023-11-09 LAB — TSH: TSH: 2.21 u[IU]/mL (ref 0.35–5.50)

## 2023-11-09 LAB — VITAMIN D 25 HYDROXY (VIT D DEFICIENCY, FRACTURES): VITD: 37.86 ng/mL (ref 30.00–100.00)

## 2023-11-09 NOTE — Patient Instructions (Addendum)

## 2023-11-09 NOTE — Progress Notes (Signed)
 Patient ID: Kristina Rhodes, female  DOB: 12-03-1990, 33 y.o.   MRN: [id number] Patient Care Team    Relationship Specialty Notifications Start End  Kristina Shope, DO PCP - General Family Medicine  08/30/21   Kristina Glasgow, DO Consulting Physician Obstetrics and Gynecology  02/13/17     Chief Complaint  Patient presents with   Annual Exam    Pt is fasting.     Subjective: Kristina Rhodes is a 33 y.o.  Female  present for CPE  All past medical history, surgical history, allergies, family history, immunizations, medications and social history were updated in the electronic medical record today. All recent labs, ED visits and hospitalizations within the last year were reviewed.  Health maintenance:  Colonoscopy: No Fhx, Screen at 43 Mammogram: FHX present. Screen at 38, baseline prior.  Cervical cancer screening: has GYN, Kristina Rhodes UTD 01/2020 within normal limits/negative HPV Immunizations: UTD tdap 2021, Influenza- 06/2023 UTD. HPV series completed Infectious disease screening: HIV completed.hep c completed DEXA:routine screen Patient has a Dental home. Hospitalizations/ED visits: Reviewed     11/09/2023    8:12 AM 11/06/2022    8:33 AM 11/05/2021    9:12 AM 05/06/2019    8:03 AM 05/04/2018    8:45 AM  Depression screen PHQ 2/9  Decreased Interest 0 0 0 0 0  Down, Depressed, Hopeless 0 0 0 0 0  PHQ - 2 Score 0 0 0 0 0  Altered sleeping 0 0     Tired, decreased energy 0 2     Change in appetite 0      Feeling bad or failure about yourself  0 0     Trouble concentrating 0      Moving slowly or fidgety/restless 0 0     Suicidal thoughts 0 0     PHQ-9 Score 0 2     Difficult doing work/chores Not difficult at all          11/09/2023    8:12 AM 11/06/2022    8:34 AM  GAD 7 : Generalized Anxiety Score  Nervous, Anxious, on Edge 1   Control/stop worrying 0 0  Worry too much - different things 0 1  Trouble relaxing 1 0  Restless 0 0  Easily annoyed or irritable 0 1  Afraid -  awful might happen 0 0  Total GAD 7 Score 2   Anxiety Difficulty Not difficult at all      Immunization History  Administered Date(s) Administered   DTaP 04/20/1989, 07/08/1989, 09/03/1989, 08/30/1990, 06/24/1994   HPV Quadrivalent 09/16/2004, 11/21/2005, 03/18/2006   Hepatitis A 09/16/2005, 03/18/2006   Hepatitis A, Ped/Adol-2 Dose 09/16/2005, 03/18/2006   Hepatitis B 06/24/1994, 08/06/1994, 01/23/1995   Hepatitis B, PED/ADOLESCENT 06/24/1994, 08/06/1994, 01/23/1995   IPV 04/20/1989, 07/08/1989, 08/30/1990, 06/24/1994   Influenza Split 07/08/1989, 06/29/2013   Influenza, Seasonal, Injecte, Preservative Fre 06/29/2013   Influenza,inj,Quad PF,6+ Mos 06/29/2013, 07/18/2018, 07/15/2019, 06/29/2022   Influenza-Unspecified 06/29/2021, 07/06/2023   MMR 05/26/1990, 06/05/1994, 05/04/2018   Meningococcal Conjugate 03/18/2006   PFIZER(Purple Top)SARS-COV-2 Vaccination 11/27/2019, 12/17/2019   Pfizer Covid-19 Vaccine Bivalent Booster 57yrs & up 06/14/2021   Td 12/13/2002   Tdap 03/22/2007, 02/13/2017, 12/17/2017, 07/10/2020   Unspecified SARS-COV-2 Vaccination 06/29/2022   Varicella 08/06/1994, 03/08/2007    Past Medical History:  Diagnosis Date   Allergy    Anemia    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   questionable dx but will ppx at 36w   Gestational hypertension 09/05/2020  Heart murmur    Hypercalcemia 05/06/2018   Hyperparathyroidism (HCC)    Pregnancy induced hypertension 2021   Primary hyperparathyroidism (HCC) 10/21/2019   Allergies  Allergen Reactions   Amoxicillin Hives    Has patient had a PCN reaction causing immediate rash, facial/tongue/throat swelling, SOB or lightheadedness with hypotension: Yes Has patient had a PCN reaction causing severe rash involving mucus membranes or skin necrosis: No Has patient had a PCN reaction that required hospitalization: No Has patient had a PCN reaction occurring within the last 10 years: yes If all of the above  answers are "NO", then may proceed with Cephalosporin use.   Keflex [Cephalexin] Hives   Penicillins Hives    Has patient had a PCN reaction causing immediate rash, facial/tongue/throat swelling, SOB or lightheadedness with hypotension: Yes Has patient had a PCN reaction causing severe rash involving mucus membranes or skin necrosis: No Has patient had a PCN reaction that required hospitalization: No Has patient had a PCN reaction occurring within the last 10 years: yes If all of the above answers are "NO", then may proceed with Cephalosporin use.   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    Drinks caffeine. Herbal remedy use. Daily vitamin use.    Wears her seatbelt and bicycle helmet.    Exercises routinely.    Regular diet.    Smoke detector in the home. No firearms in the home.    Feels safe in her relationships.     Allergies as of 11/09/2023       Reactions   Amoxicillin Hives   Has patient had a PCN reaction causing immediate rash, facial/tongue/throat swelling, SOB or lightheadedness with hypotension: Yes Has patient had a PCN reaction causing severe rash involving mucus membranes or skin necrosis: No Has patient had a PCN reaction that required hospitalization: No Has patient had a PCN reaction occurring within the last 10 years: yes If all of the above answers are "NO", then may proceed with  Cephalosporin use.   Keflex [cephalexin] Hives   Penicillins Hives   Has patient had a PCN reaction causing immediate rash, facial/tongue/throat swelling, SOB or lightheadedness with hypotension: Yes Has patient had a PCN reaction causing severe rash involving mucus membranes or skin necrosis: No Has patient had a PCN reaction that required hospitalization: No Has patient had a PCN reaction occurring within the last 10 years: yes If all of the above answers are "NO", then may proceed with Cephalosporin use.   Cephalosporins Rash        Medication List        Accurate as of November 09, 2023  8:26 AM. If you have any questions, ask your nurse or doctor.          STOP taking these medications    prenatal multivitamin Tabs tablet Stopped  by: Napolean Backbone       TAKE these medications    cholecalciferol 25 MCG (1000 UNIT) tablet Commonly known as: VITAMIN D3 Take 3,000 Units by mouth daily.   magnesium 30 MG tablet Take 30 mg by mouth 2 (two) times daily.   vitamin C 100 MG tablet Take 100 mg by mouth daily.   ZINC PO Take by mouth.        All past medical history, surgical history, allergies, family history, immunizations andmedications were updated in the EMR today and reviewed under the history and medication portions of their EMR.     No results found for this or any previous visit (from the past 2160 hours).  No results found.  ROS 14 pt review of systems performed and negative (unless mentioned in an HPI)  Objective: BP 118/74   Pulse 76   Temp 98 F (36.7 C)   Ht 5\' 1"  (1.549 m)   Wt 135 lb 9.6 oz (61.5 kg)   SpO2 99%   BMI 25.62 kg/m  Physical Exam Vitals and nursing note reviewed.  Constitutional:      General: She is not in acute distress.    Appearance: Normal appearance. She is not ill-appearing or toxic-appearing.  HENT:     Head: Normocephalic and atraumatic.     Right Ear: Tympanic membrane, ear canal and external ear normal. There  is no impacted cerumen.     Left Ear: Tympanic membrane, ear canal and external ear normal. There is no impacted cerumen.     Nose: No congestion or rhinorrhea.     Mouth/Throat:     Mouth: Mucous membranes are moist.     Pharynx: Oropharynx is clear. No oropharyngeal exudate or posterior oropharyngeal erythema.  Eyes:     General: No scleral icterus.       Right eye: No discharge.        Left eye: No discharge.     Extraocular Movements: Extraocular movements intact.     Conjunctiva/sclera: Conjunctivae normal.     Pupils: Pupils are equal, round, and reactive to light.  Cardiovascular:     Rate and Rhythm: Normal rate and regular rhythm.     Pulses: Normal pulses.     Heart sounds: Normal heart sounds. No murmur heard.    No friction rub. No gallop.  Pulmonary:     Effort: Pulmonary effort is normal. No respiratory distress.     Breath sounds: Normal breath sounds. No stridor. No wheezing, rhonchi or rales.  Chest:     Chest wall: No tenderness.  Abdominal:     General: Abdomen is flat. Bowel sounds are normal. There is no distension.     Palpations: Abdomen is soft. There is no mass.     Tenderness: There is no abdominal tenderness. There is no right CVA tenderness, left CVA tenderness, guarding or rebound.     Hernia: No hernia is present.  Musculoskeletal:        General: No swelling, tenderness or deformity. Normal range of motion.     Cervical back: Normal range of motion and neck supple. No rigidity or tenderness.     Right lower leg: No edema.     Left lower leg: No edema.  Lymphadenopathy:     Cervical: No cervical adenopathy.  Skin:    General: Skin is warm and dry.     Coloration: Skin is not jaundiced or pale.     Findings: No bruising, erythema, lesion or rash.  Neurological:     General: No focal deficit present.     Mental Status: She is alert and oriented to person, place, and time. Mental status is at baseline.     Cranial Nerves: No cranial nerve deficit.      Sensory: No sensory deficit.     Motor: No weakness.     Coordination: Coordination normal.     Gait: Gait normal.     Deep Tendon Reflexes: Reflexes normal.  Psychiatric:        Mood and Affect: Mood normal.        Behavior: Behavior normal.        Thought Content: Thought content normal.        Judgment: Judgment normal.      No results found.  Assessment/plan: Kristina Rhodes is a 33 y.o. 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 Patient was encouraged to choose a diet filled with fresh fruits and vegetables, and lean meats. AVS provided to patient today for education/recommendation on gender specific health and safety maintenance. Colonoscopy: No Fhx, Screen at 43 Mammogram: FHX present. Screen at 38, baseline prior.  Cervical cancer screening: has GYN, Kristina Rhodes UTD 01/2020 within normal limits/negative HPV Immunizations: UTD tdap 2021, Influenza- 06/2023 UTD. HPV series completed Infectious disease screening: HIV completed.hep c completed DEXA:routine screen CBC, CMP and TSH collected today  Return in about 1 year (around 11/09/2024) for cpe (20 min), Routine chronic condition follow-up.  Orders Placed This Encounter  Procedures   CBC   Comprehensive metabolic panel   Lipid panel   TSH   PTH, Intact and Calcium   Vitamin D  (25 hydroxy)   No orders of the defined types were placed in this encounter.  Referral Orders  No referral(s) requested today    Electronically signed by: Napolean Backbone, DO [facility]

## 2023-11-10 ENCOUNTER — Encounter: Payer: Self-pay | Admitting: Family Medicine

## 2023-11-10 LAB — PTH, INTACT AND CALCIUM
Calcium: 9.4 mg/dL (ref 8.6–10.2)
PTH: 27 pg/mL (ref 16–77)

## 2023-11-27 ENCOUNTER — Encounter: Payer: Self-pay | Admitting: Family Medicine

## 2024-11-11 ENCOUNTER — Encounter: Payer: 59 | Admitting: Family Medicine

## 2024-12-01 ENCOUNTER — Encounter: Admitting: Family Medicine
